# Patient Record
Sex: Male | Born: 1955 | Race: White | Hispanic: No | Smoking: Former smoker
Health system: Southern US, Community
[De-identification: ages and names within clinical notes are randomized; demographics above are authoritative.]

## PROBLEM LIST (undated history)

## (undated) DIAGNOSIS — I499 Cardiac arrhythmia, unspecified: Secondary | ICD-10-CM

## (undated) DIAGNOSIS — G4710 Hypersomnia, unspecified: Secondary | ICD-10-CM

## (undated) DIAGNOSIS — G4730 Sleep apnea, unspecified: Secondary | ICD-10-CM

## (undated) DIAGNOSIS — E785 Hyperlipidemia, unspecified: Secondary | ICD-10-CM

## (undated) DIAGNOSIS — I639 Cerebral infarction, unspecified: Secondary | ICD-10-CM

## (undated) DIAGNOSIS — I4821 Permanent atrial fibrillation: Secondary | ICD-10-CM

## (undated) HISTORY — DX: Sleep apnea, unspecified: G47.10

## (undated) HISTORY — DX: Permanent atrial fibrillation: I48.21

## (undated) HISTORY — DX: Sleep apnea, unspecified: G47.30

## (undated) HISTORY — PX: ELBOW SURGERY: SHX618

## (undated) HISTORY — DX: Cerebral infarction, unspecified: I63.9

## (undated) HISTORY — DX: Hyperlipidemia, unspecified: E78.5

---

## 2001-03-23 ENCOUNTER — Encounter (HOSPITAL_COMMUNITY): Admission: RE | Admit: 2001-03-23 | Discharge: 2001-04-22 | Payer: Self-pay

## 2001-09-07 ENCOUNTER — Emergency Department (HOSPITAL_COMMUNITY): Admission: EM | Admit: 2001-09-07 | Discharge: 2001-09-07 | Payer: Self-pay | Admitting: Emergency Medicine

## 2001-09-12 ENCOUNTER — Ambulatory Visit (HOSPITAL_COMMUNITY): Admission: RE | Admit: 2001-09-12 | Discharge: 2001-09-12 | Payer: Self-pay | Admitting: Family Medicine

## 2001-09-12 ENCOUNTER — Encounter: Payer: Self-pay | Admitting: Family Medicine

## 2001-09-22 ENCOUNTER — Ambulatory Visit (HOSPITAL_COMMUNITY): Admission: RE | Admit: 2001-09-22 | Discharge: 2001-09-22 | Payer: Self-pay | Admitting: Family Medicine

## 2001-09-22 ENCOUNTER — Encounter: Payer: Self-pay | Admitting: Family Medicine

## 2002-03-06 ENCOUNTER — Ambulatory Visit (HOSPITAL_COMMUNITY): Admission: RE | Admit: 2002-03-06 | Discharge: 2002-03-06 | Payer: Self-pay | Admitting: Family Medicine

## 2002-03-06 ENCOUNTER — Encounter: Payer: Self-pay | Admitting: Family Medicine

## 2003-02-14 ENCOUNTER — Ambulatory Visit (HOSPITAL_COMMUNITY): Admission: RE | Admit: 2003-02-14 | Discharge: 2003-02-14 | Payer: Self-pay | Admitting: Family Medicine

## 2003-02-14 ENCOUNTER — Encounter: Payer: Self-pay | Admitting: Family Medicine

## 2004-05-19 ENCOUNTER — Ambulatory Visit (HOSPITAL_COMMUNITY): Admission: RE | Admit: 2004-05-19 | Discharge: 2004-05-19 | Payer: Self-pay | Admitting: Family Medicine

## 2004-06-19 ENCOUNTER — Encounter (HOSPITAL_COMMUNITY): Admission: RE | Admit: 2004-06-19 | Discharge: 2004-07-19 | Payer: Self-pay | Admitting: Family Medicine

## 2007-02-16 ENCOUNTER — Encounter: Admission: RE | Admit: 2007-02-16 | Discharge: 2007-02-16 | Payer: Self-pay | Admitting: Orthopedic Surgery

## 2008-01-07 ENCOUNTER — Ambulatory Visit: Payer: Self-pay | Admitting: Pulmonary Disease

## 2008-01-07 ENCOUNTER — Inpatient Hospital Stay (HOSPITAL_COMMUNITY): Admission: EM | Admit: 2008-01-07 | Discharge: 2008-01-16 | Payer: Self-pay | Admitting: Emergency Medicine

## 2008-01-07 ENCOUNTER — Encounter (INDEPENDENT_AMBULATORY_CARE_PROVIDER_SITE_OTHER): Payer: Self-pay | Admitting: Neurology

## 2008-01-07 ENCOUNTER — Encounter: Payer: Self-pay | Admitting: Emergency Medicine

## 2008-01-08 ENCOUNTER — Encounter (INDEPENDENT_AMBULATORY_CARE_PROVIDER_SITE_OTHER): Payer: Self-pay | Admitting: Neurology

## 2008-01-08 ENCOUNTER — Ambulatory Visit: Payer: Self-pay | Admitting: Surgery

## 2008-01-09 ENCOUNTER — Ambulatory Visit: Payer: Self-pay | Admitting: Physical Medicine & Rehabilitation

## 2008-05-25 ENCOUNTER — Ambulatory Visit (HOSPITAL_COMMUNITY): Admission: RE | Admit: 2008-05-25 | Discharge: 2008-05-25 | Payer: Self-pay | Admitting: Interventional Radiology

## 2008-05-31 ENCOUNTER — Encounter: Payer: Self-pay | Admitting: Interventional Radiology

## 2008-06-06 ENCOUNTER — Ambulatory Visit (HOSPITAL_COMMUNITY): Admission: RE | Admit: 2008-06-06 | Discharge: 2008-06-06 | Payer: Self-pay | Admitting: Cardiology

## 2008-06-06 ENCOUNTER — Encounter (INDEPENDENT_AMBULATORY_CARE_PROVIDER_SITE_OTHER): Payer: Self-pay | Admitting: Cardiology

## 2008-06-07 ENCOUNTER — Encounter: Admission: RE | Admit: 2008-06-07 | Discharge: 2008-06-07 | Payer: Self-pay | Admitting: Orthopedic Surgery

## 2008-06-18 HISTORY — PX: CARDIOVASCULAR STRESS TEST: SHX262

## 2008-07-04 ENCOUNTER — Ambulatory Visit (HOSPITAL_COMMUNITY): Admission: RE | Admit: 2008-07-04 | Discharge: 2008-07-04 | Payer: Self-pay | Admitting: Interventional Radiology

## 2008-07-04 HISTORY — PX: CEREBRAL ANGIOGRAM: SHX1326

## 2009-03-01 ENCOUNTER — Ambulatory Visit (HOSPITAL_COMMUNITY): Admission: RE | Admit: 2009-03-01 | Discharge: 2009-03-01 | Payer: Self-pay | Admitting: Interventional Radiology

## 2009-08-06 IMAGING — CT CT HEAD W/O CM
1 series · 16 of 30 positions shown, 20 images · non-contrast
Comparison: CT from earlier today

CLINICAL DATA: Follow-up after t-PA treatment

CT HEAD WITHOUT CONTRAST
TECHNIQUE: Contiguous axial images were obtained from the base of
the skull through the vertex without contrast.

[Series 2: head routine 4.8 h37s · axial · 0.48mm/px · z∈[-120,+40]mm · 16 of 36 slices shown, 20 images]
[im 2/36  brain]
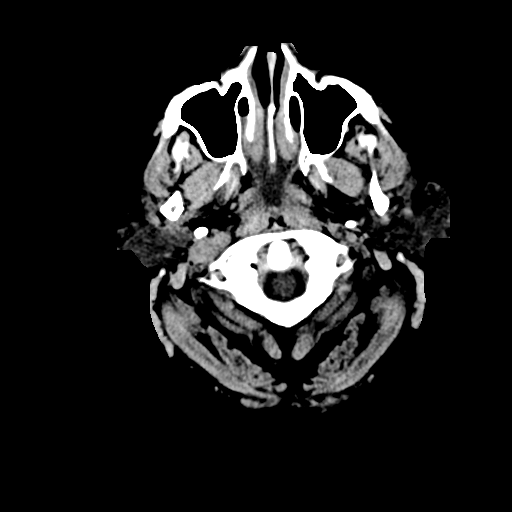
[im 2/36  bone]
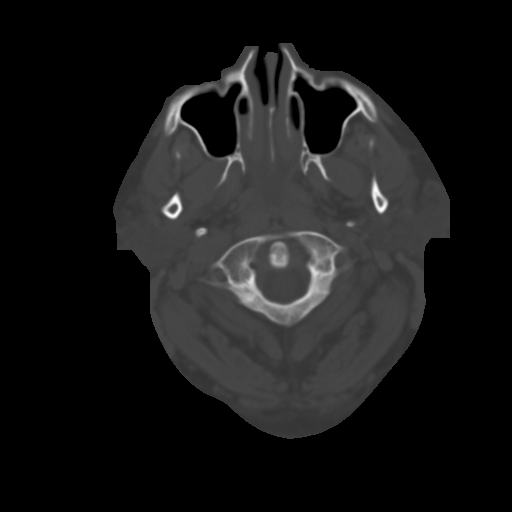
[im 4/36  brain]
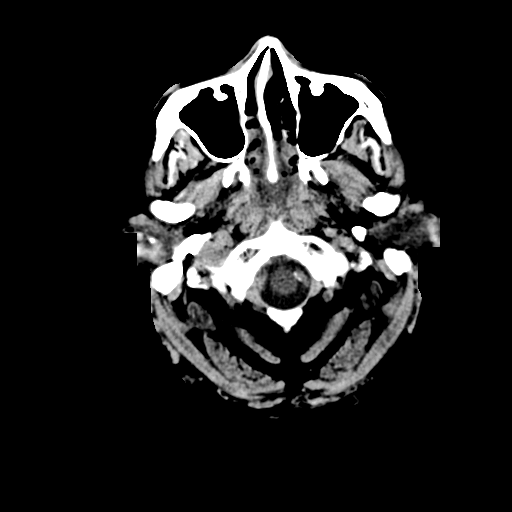
[im 7/36  brain]
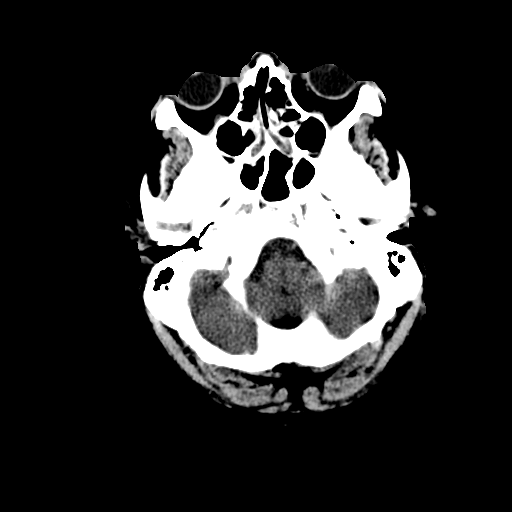
[im 9/36  brain]
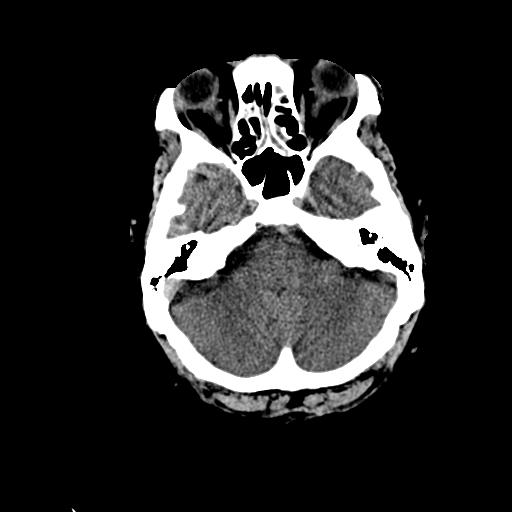
[im 10/36  brain]
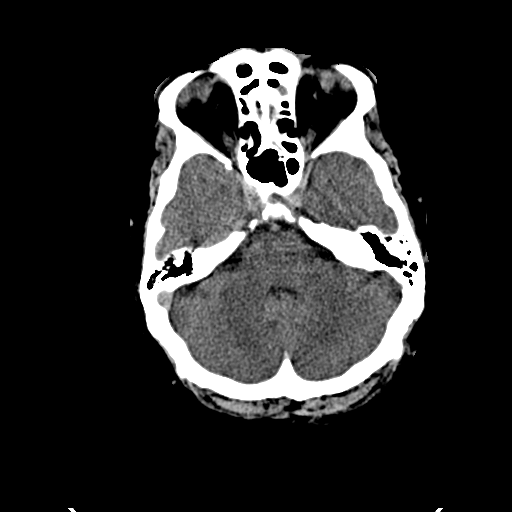
[im 10/36  bone]
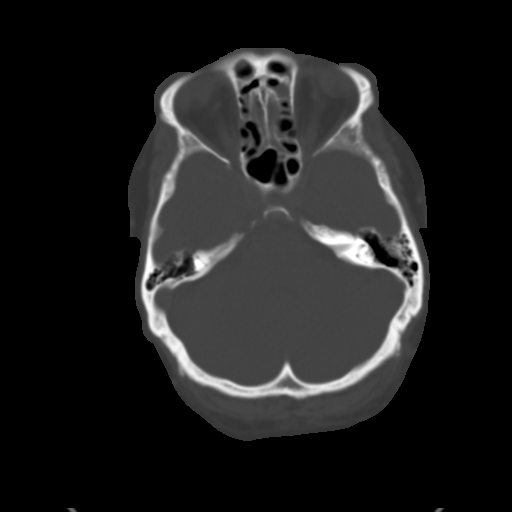
[im 13/36  brain]
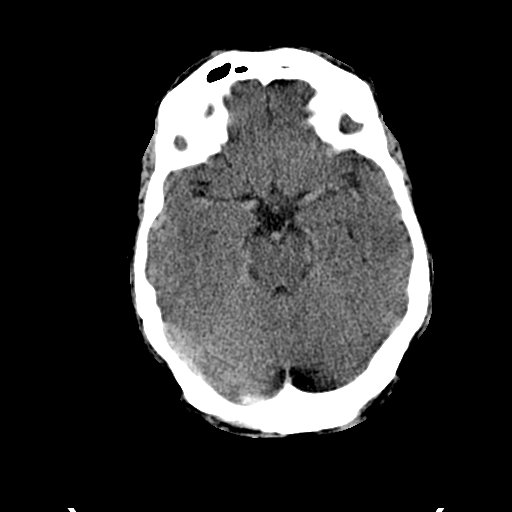
[im 15/36  brain]
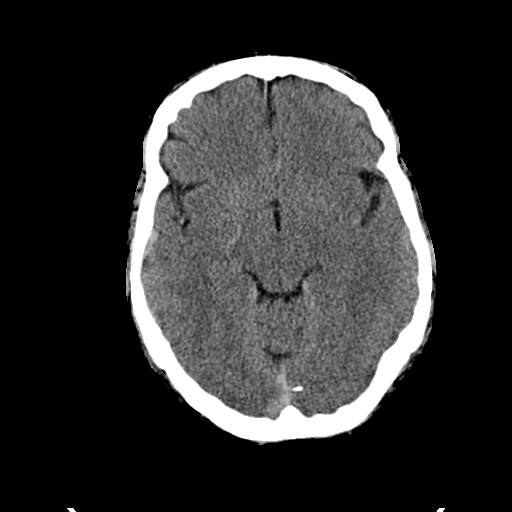
[im 17/36  brain]
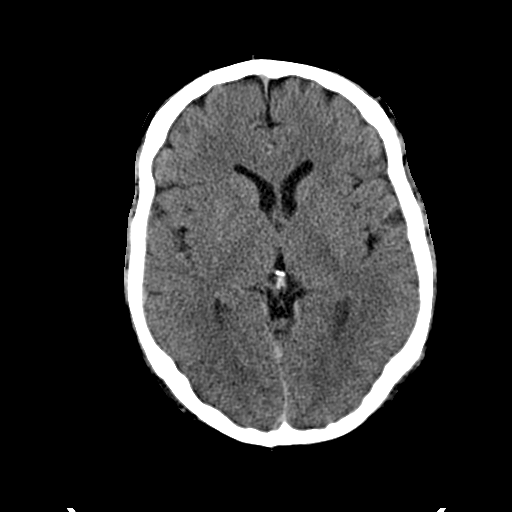
[im 19/36  brain]
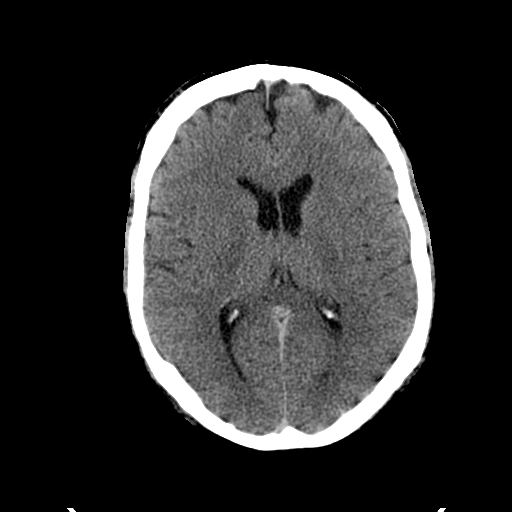
[im 19/36  bone]
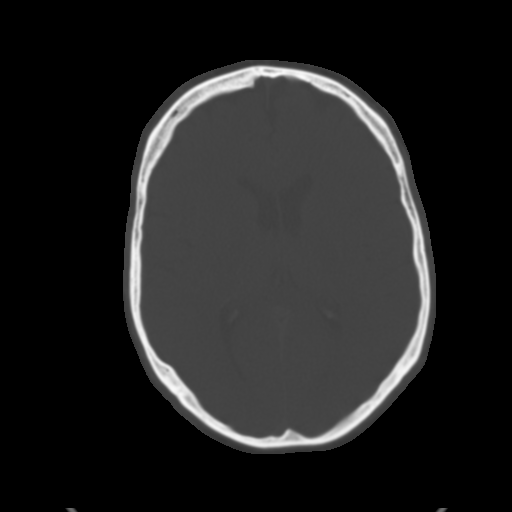
[im 21/36  brain]
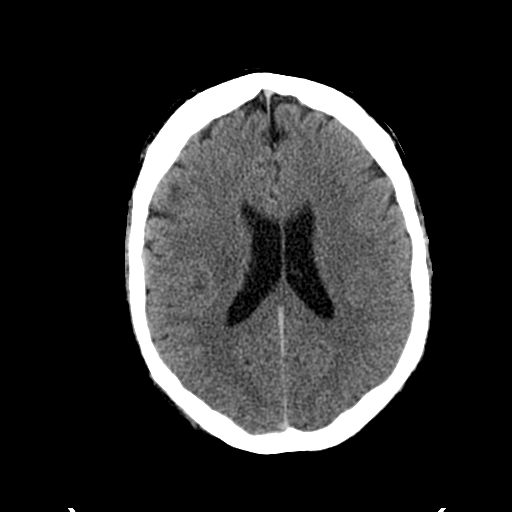
[im 23/36  brain]
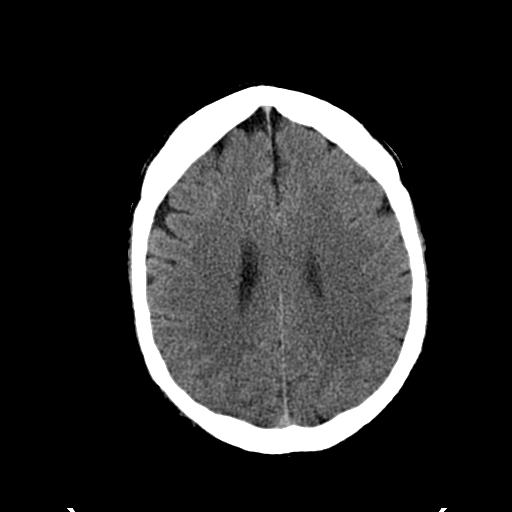
[im 26/36  brain]
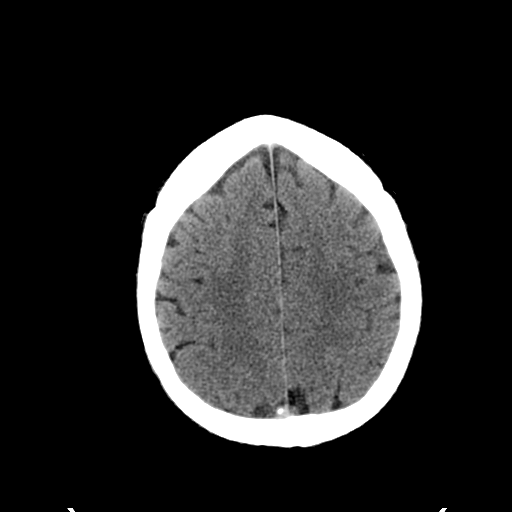
[im 27/36  brain]
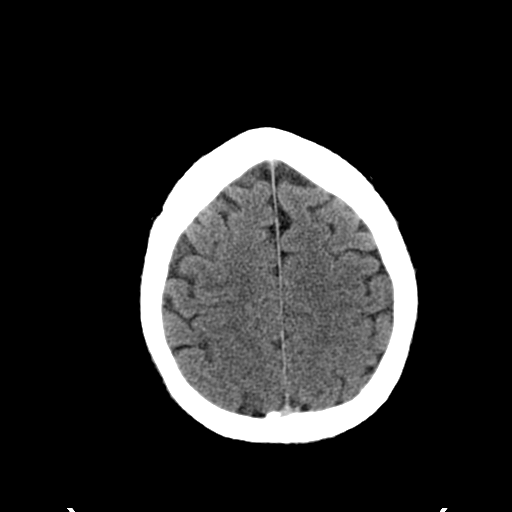
[im 27/36  bone]
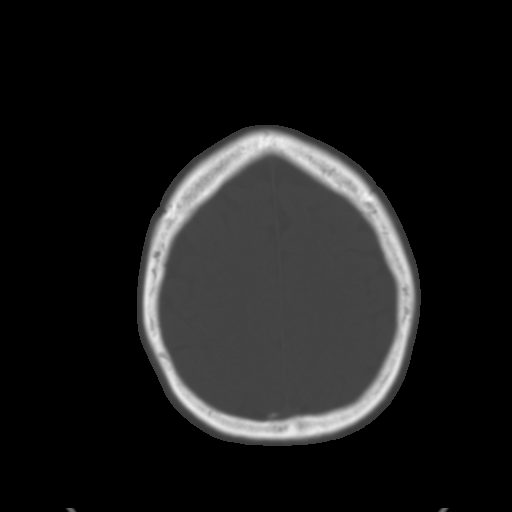
[im 29/36  brain]
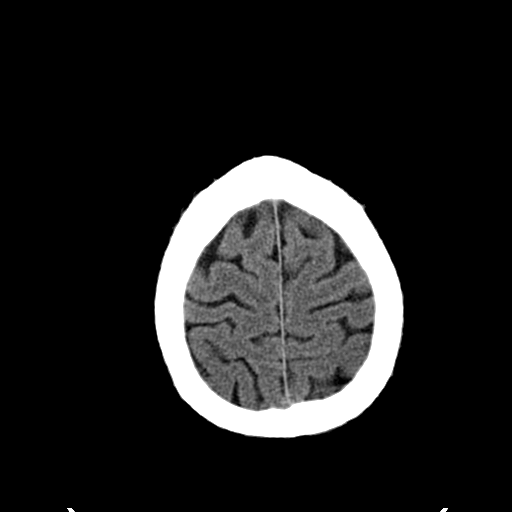
[im 32/36  brain]
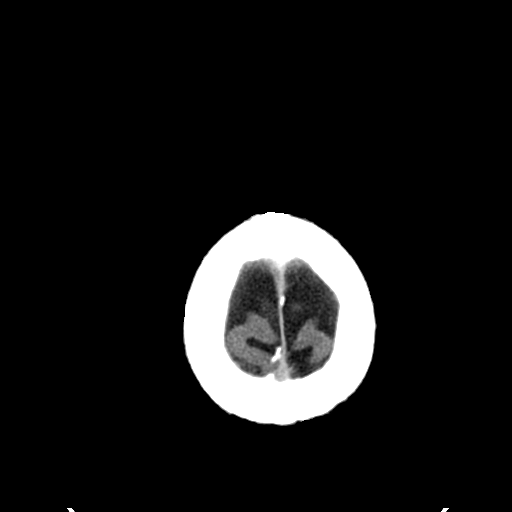
[im 34/36  brain]
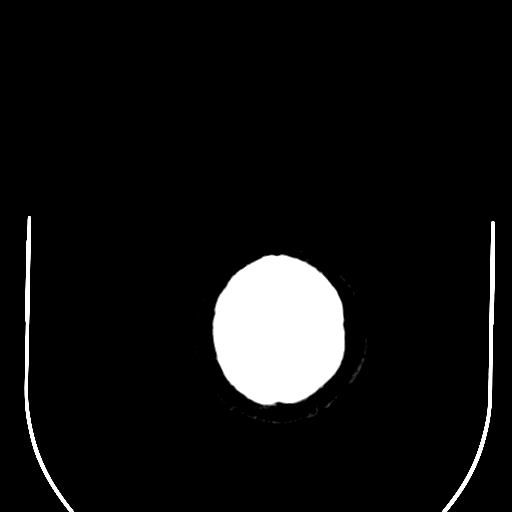

[16 of 30 positions shown; findings below may reference images not displayed]

FINDINGS: There is contrast throughout the circle of Willis from
the arteriographic evaluation.  However the MCA's appear
symmetrically dense and the area of thrombosis appears to have been
treated.  The only questionable abnormality is an area of very
vague low attenuation deep in the white matter on the right on
images number 20 and 21 which may represent edema or early infarct.
No acute blood is seen.  Ethmoid sinus disease is noted.
IMPRESSION: The middle cerebral arteries appear symmetrically dense after t-PA
treatment.  No acute blood is seen. Small vague area of low
attenuation deep in the right white matter suspicious for edema,
possible early infarction.

## 2009-08-06 IMAGING — XA IR ANGIO/CAROTID/CERV BI
1 series · 10 of 24 positions shown · non-contrast
Comparison: CT scan of the brain of 01/07/2008.

01/12/08 – DUPLICATE COPY for exam association in [facility] – No change from original report.
 Clinical Data acute onset of left side weakness with gaze deviation
 and dysarthria. Bilateral carotid arteriograms, left vertebral
 artery angiogram, and endovascular revascularization of occluded
 right internal carotid artery, the right middle cerebral artery and
 partially the right anterior cerebral artery using intra-arterial
 TPA AND MECHANICAL THROMBECTOMY.

[Series 1: run · 10 of 243 slices shown]
[im 11/243]
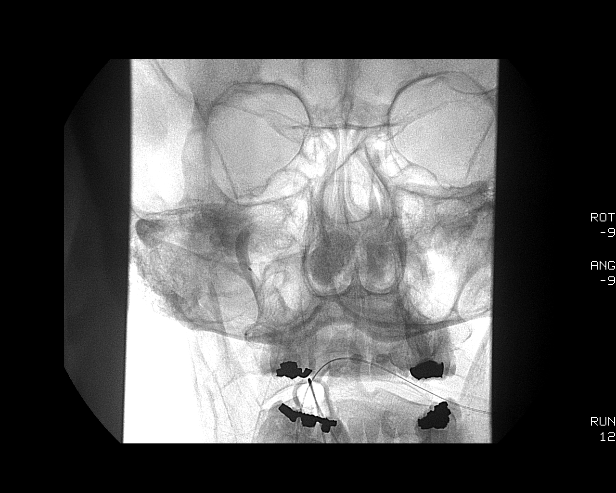
[im 32/243]
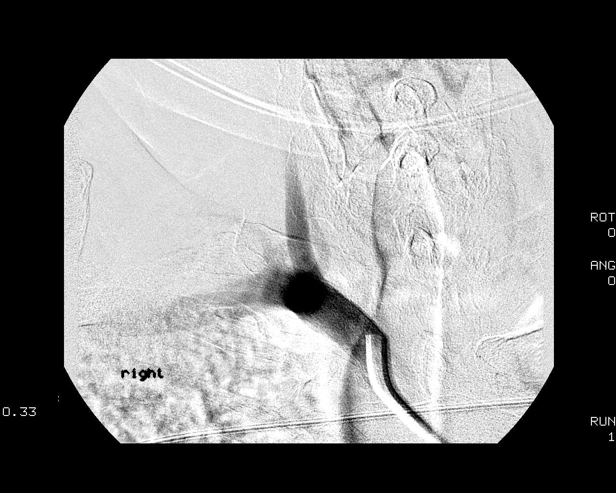
[im 64/243]
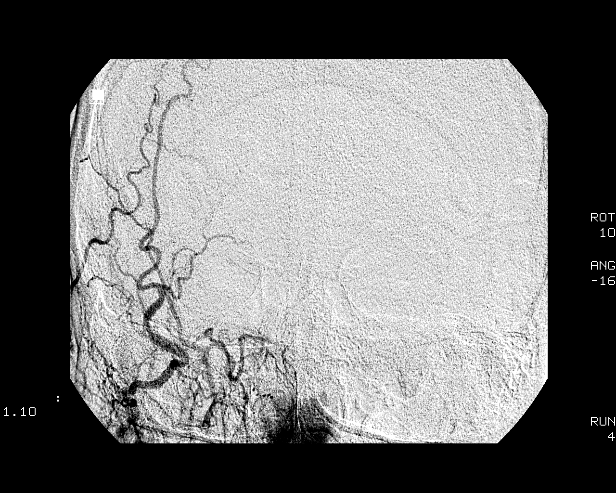
[im 85/243]
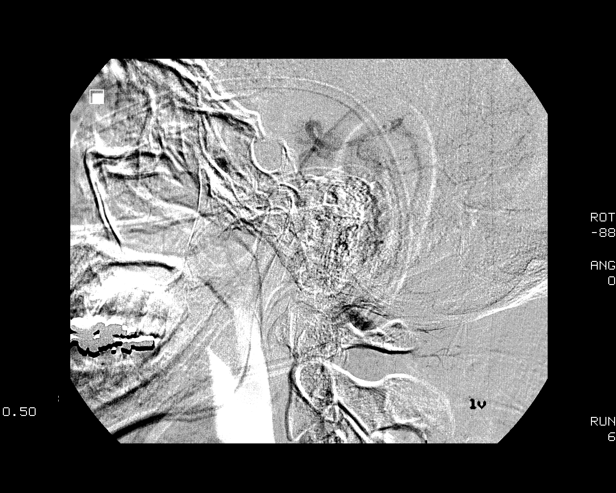
[im 106/243]
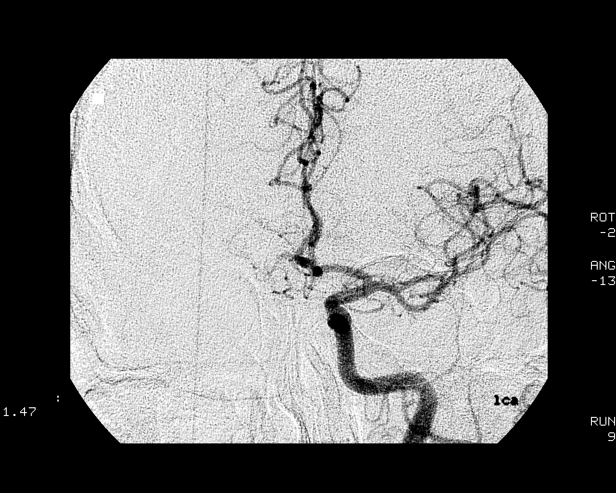
[im 137/243]
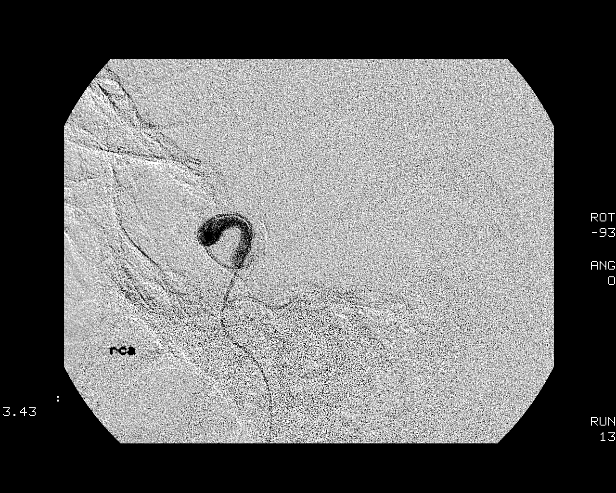
[im 158/243]
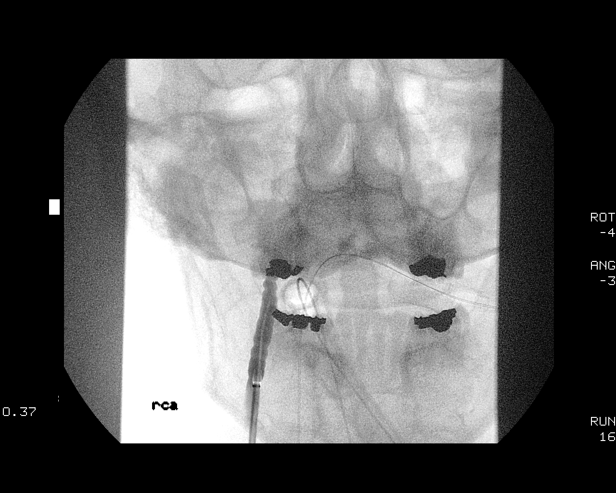
[im 190/243]
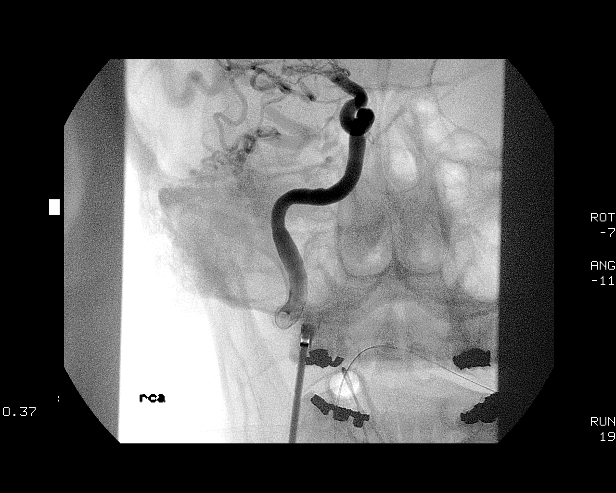
[im 211/243]
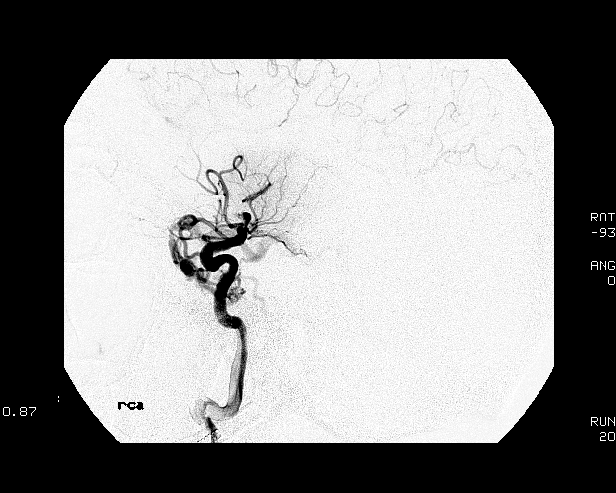
[im 232/243]
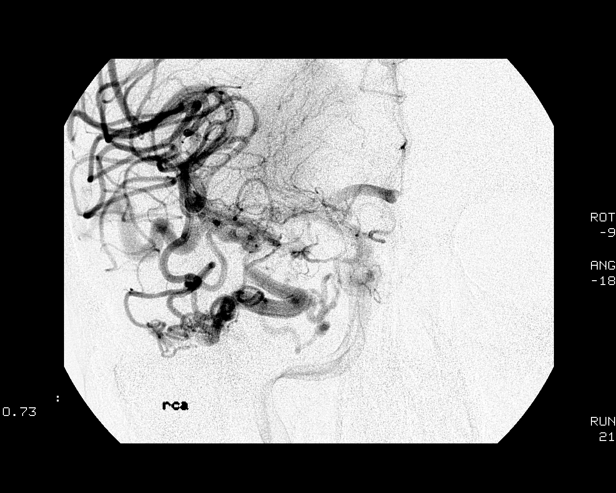

[10 of 24 positions shown; findings below may reference images not displayed]

FINDINGS: Following full explanation of the procedure along with
 potential associated complications, an informed witnessed consent
 was obtained.

 The right groin was prepped and draped in usual sterile fashion.
 Thereafter using a modified Seldinger technique, transfemoral
 access into the right common femoral artery was obtained without
 difficulty. Over 0.035 inches guide wire, a 5-French Pinnacle
 sheath was inserted. Through this and also 0.035 inch guide wire,
 5-French JB-1 catheter was advanced through aortic arch regions and
 was selectively positioned in the right common carotid artery, the
 left vertebral artery, and the left common carotid artery.

 There were no acute complications. The patient tolerated the
 procedure well.

 Medication utilized: Versed 1 mg IV. Fentanyl 25 mcg IV.

 Contrast: Omnipaque 300, approximately 35 cc.

 The right common carotid arteriogram demonstrates the right
 external carotid artery and its major branches to be normal.

 The right internal carotid artery at the bulb is patent. There is
 a tapered severe narrowing to complete occlusion of the right
 internal carotid artery to the level of C1- C2. No distal
 reconstitution of the right internal carotid artery at the skull
 base or intracranially is noted.

 The left vertebral origin is normal. The vessel is seen to opacify
 normally to the cranial skull base. No opacification is seen on
 left posterior-inferior cerebral artery and the left
 vertebrobasilar junction. The basilar artery, posterior cerebral
 arteries, superior cerebral arteries and the anterior- inferior
 cerebral arteries are seen to opacify normally into capillary and
 venous phases.

 The left common carotid arteriogram demonstrates left external
 carotid artery and its major branches to be normal.

 The left internal carotid artery at the bulb to the cranial skull
 base is normally opacified. The petrous, cavernous and
 supraclinoid segments are normal.

 The left middle and left anterior cerebral arteries are also seen
 to opacify normally into capillary and venous phases.

 Cross opacification via the anterior communicating artery region is
 noted of the right anterior cerebral artery and A2 segment, and
 partially in the A1 segment.
IMPRESSION: 1. Complete occlusion of the right internal carotid artery
 extracranially and intracranially.
 2. Angiographic occlusion of the right middle cerebral artery and
 partially the right anterior cerebral artery A1 segment

 ENDOVASCULAR REVASCULARIZATION OF THE OCCLUDED RIGHT INTERNAL
 CAROTID ARTERY INTRA AND EXTRA CRANIALLY, AND THE RIGHT MIDDLE
 CEREBRAL ARTERY AND THE RIGHT ANTERIOR CEREBRAL ARTERY USING
 MECHANICAL THROMBECTOMY IN THE FORM MERCI RETRIEVAL DEVICE AND
 INTRA-ARTERIAL SUPERSELECTIVE TPA INFUSION.

 The findings of the angiogram were reviewed with the referring
 neurologist. It was decided to proceed with endovascular
 revascularization to improve prior subsequent neurological outcome.
 The risks of the procedure including a 10-15% chance of
 intracranial hemorrhage with the potential for worsening neurologic
 function, death, and ventilator dependency were discussed with the
 patient's family.

 After informed consent, the patient's was put general anesthesia by
 the [REDACTED], [HOSPITAL].

 The diagnostic JB-1 5-French diagnostic catheter was exchanged over
 a 0.035 inch 300 cm Rosen exchange guide wire for an 8-French 55 cm
 neurovascular sheath using biplane roadmap technique and constant
 fluoroscopic guidance. Good aspiration was obtained from the side
 port of the neurovascular sheath. A gentle contrast ejection
 demonstrated no evidence of spasms, dissections or intraluminal
 filling defects. This was then connected to continue Heparin and
 saline infusion.

 Over the Rosen exchange guide wire, a 90 cm 8-French Merci guide
 catheter was advanced and positioned just proximal to the right
 common carotid bifurcation. The guide wire was removed. There was
 good aspiration obtained from the hub of the 8-French guide
 catheter. A gentle contrast injection demonstrated no evidence of
 spasms, dissections or intraluminal filling defects.

 Over 0.035 inch Roadrunner guide wire using biplane roadmap
 technique and constant fluoroscopic guidance, the 8-French guide
 catheter was positioned just distal to the right internal carotid
 bulb. Again been no evidence of filling defects or dissection are
 noted in the right internal carotid artery. No change in the
 intracranial circulation.

 At this time in a coaxial manner and with constant heparin and
 saline infusion using biplane roadmap technique and constant
 fluoroscopic, over a 0.014-inch Soft-tip Transcend EX
 microguidewire with the J-tip configuration, an 18L Merci
 microcatheter which had been steam shaved was advanced and
 positioned to the distal end of the 8-French guide catheter.

 With the microguidewire leading in a J-tip configuration, access
 was obtained into the right internal carotid artery at the cervical
 petrous junction. At this time, the microguidewire was removed.
 Good aspiration was obtained from the hub of the microcatheter.

 Using biplane roadmap technique and constant fluoroscopic guidance,
 again with the mild microguidewire leading in J-tip configuration,
 the combination of microguidewire and microcatheter was then
 advanced into the right ICA supraclinoid region and subsequently to
 the right middle cerebral artery with the microguidewire leading.
 The microguidewire was then advanced into the M2-M3 region of the
 right middle cerebral artery followed by the microcatheter.

 The microguidewire was then retrieved. Again, there is no
 significant aspiration from the hub of the microcatheter.
 Controlled arteriogram performed through the 8-French Merci guide
 catheter in the right internal carotid artery and again
 demonstrated no change in the intracranial circulation.

 At this time, intra-arterial infusion of TPA was started. Over 10
 minutes, 10 mg of IV TPA were infused in the right middle cerebral
 artery region.

 At the end of this, over a 0.014-inch Softip Transcend EX
 microguidewire, the microcatheter was again advanced into the M2
 region of the right middle cerebral artery. The guide wires were
 then removed. Again, there was no prompt aspiration from the hub
 of the microcatheter

 At this time, Ennis Billiot retrieval device L5 was advanced in a
 coaxial manner and with constant heparin and saline infusion using
 roadmap technique to the distal end of the microcatheter. Two
 loops of the retrieval device were then advanced followed by the
 combination being retrieved proximally to engage the large filling
 defect. The further loops were then delivered.

 The balloon in the right internal carotid was then inflated for
 proximal flow rest as a rigorous aspiration was performed from the
 side port of the retrievals on the 8-French [REDACTED] guide catheter as
 slow gradual retrieval was performed of the combination of
 retrieval device and of the microcatheter. Using a torque device,
 the retrieval device was turned in a clockwise and anticlockwise
 motion as the retrieval was being performed. This was continued
 until the retrieval was then outside the 8-French guide catheter.
 Aspiration was continued for another minute once the balloon was
 inflated. The balloon was then deflated while aspiration was
 continue to be performed rigorously. Back-bleeding was allowed for
 approximately 15-20 seconds. At the end of this, a controlled
 arteriogram was performed through the 8-French guide catheter from
 the side port demonstrated complete revascularization of the right
 internal carotid artery and right anterior cerebral artery and the
 proximal right middle cerebral artery. Large chunks of dark clot
 were obtained in the aspirate.

 Further advancement of the [REDACTED] microcatheter as described earlier
 over 0.014 inch Transcend Soft-tip EX microguidewire to the right
 MCA. Once within the clot, another 7 mg of intra-arterial TPA were
 infused in hope of softening the clot. At the end of this, another
 L5 retrieval device was advanced in a coaxial manner under constant
 heparin and saline infusion. As described above, another pass was
 made with proximal flow rest and rigorous aspiration as the
 combination of the retrieval device and the microcatheter were
 retrieved. At this time, there was no significant amount of clot
 noted in the aspirate. Also the controlled arteriogram through the
 8-French guide wire confirmed no further revascularization to the
 right including the right middle cerebral artery. This prompted
 yet another at attempt with another L5 retrieval device. Again
 after having engaged the clot with two loops of the retrieval
 device, the remaining loops were delivered. Two anti- clockwise
 and two clockwise maneuvers were performed with torque device and
 retrieval device. With rigorous aspiration, the combination was
 retrieved slowly and deliberately. The microcatheter and retrieval
 device were retrieved and removed. This retrieval device showed
 the presence of a thick pyramidal shaped clot. The aspirate also
 demonstrated chunks of clot. A controlled arteriogram performed
 through the 8-French guide catheter now demonstrated a
 revascularization of the anterior temporal branch and also the
 right M1 segment in a superior division. There continued to be a
 sudden defect probably at the origin of the inferior division of
 the right middle cerebral artery. Also demonstrated at this run
 was the presence of a nidus of an anterior malformation in the
 anterior region of the right temporal lobe fed by branches from the
 anterior temporal branch. The venous draining was noted into the
 vein of Labbe and subsequently into the right transverse sinus.

 The patient was given 1 ml of 0.25% nitroglycerin via the guide
 catheter. A control arteriogram performed subsequent to this and
 demonstrated complete revascularization of the inferior and
 superior division of the right middle cerebral artery and the right
 anterior cerebral artery with more prompt filling of the anterior
 temporal arteriovenous malformation with venous drainage via the
 cortical anterior aspect of the anterior temporal lobe into the
 vein of Labbe.

 A following control arteriogram performed through the 8-French
 guide demonstrated completely revascularization of the right middle
 cerebral artery, the right anterior cerebral artery, the internal
 carotid artery with no filling defects noted in the distal MCA
 branches.

 The guide sheath and the guide catheter were retrieved into the
 abdominal aorta and exchanged over a J-tip guide wire for a 9-
 French neurovascular sheath. This was connected to continuous
 heparin and saline fusion.

 No acute changes are noted in patient's blood pressure and
 neurological status throughout the procedure.

 The patient was then transferred to the CT scanner for post-
 procedural CT scan of the brain.
IMPRESSION: 1. Status post completely revascularization of occluded right
 internal carotid artery intracranially and extracranially, the
 right middle cerebral artery and right anterior cerebral artery
 using superselective intra-arterial infusion of TPA AND MECHANICAL
 THROMBECTOMY times three using the [REDACTED] retrieval device system.
 2. Unmasking of a moderate sized anterior temporal arteriovenous
 malformation on the right.

## 2009-08-06 IMAGING — CR DG CHEST 1V
3 series · 3 of 3 positions shown · non-contrast
Comparison: None

CLINICAL DATA: Sudden onset left-sided weakness

CHEST - 1 VIEW

[view not recorded (1 of 3)]
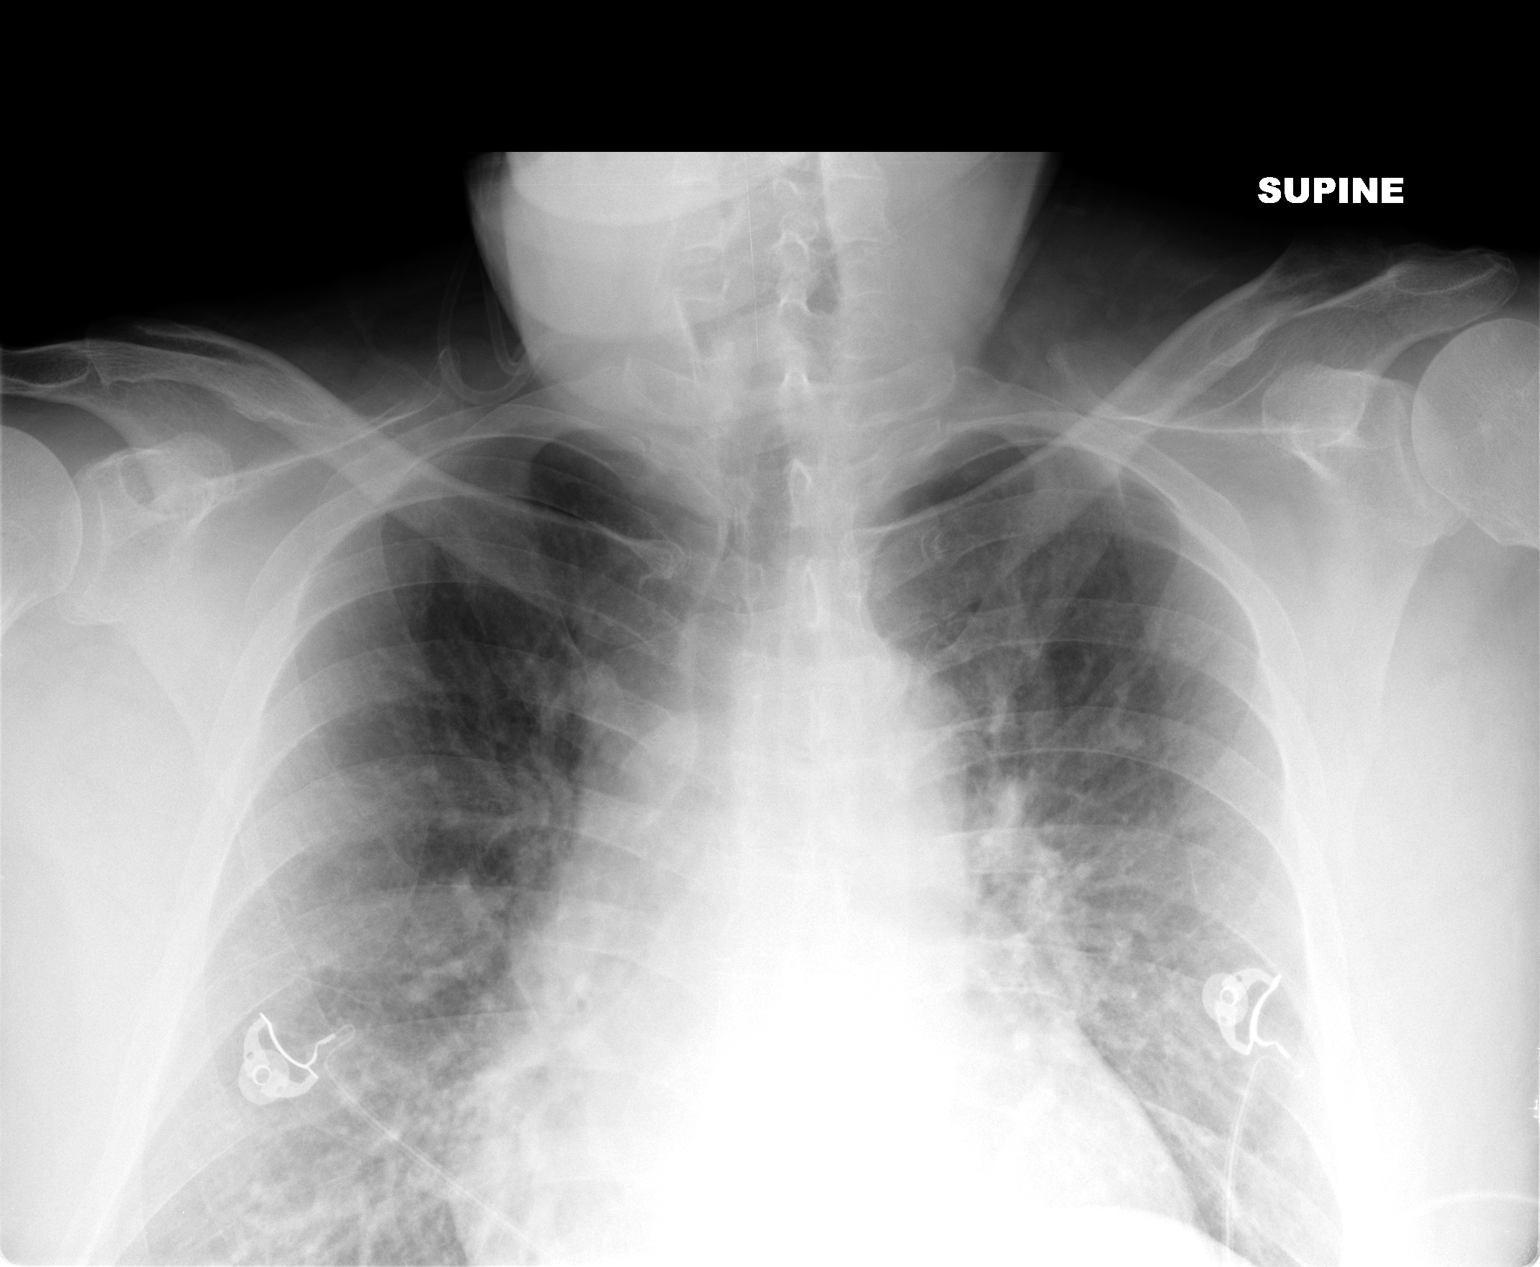

[view not recorded (2 of 3)]
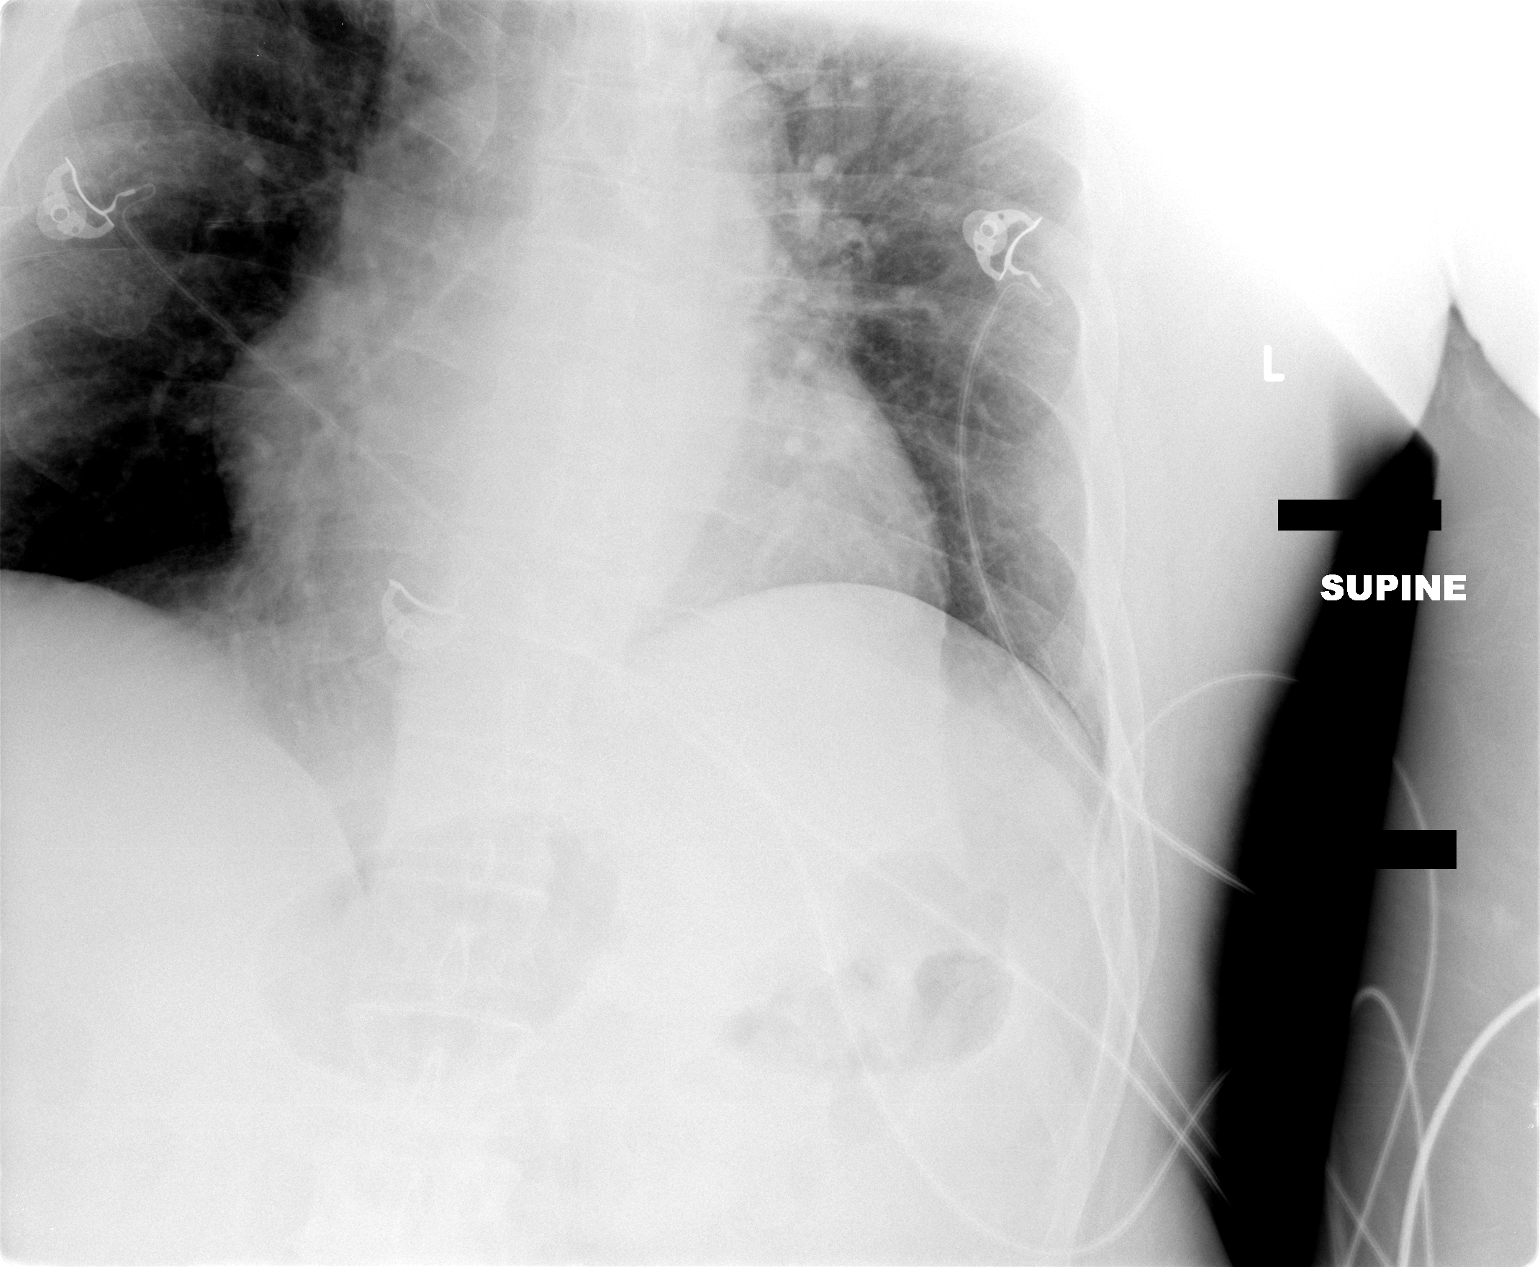

[view not recorded (3 of 3)]
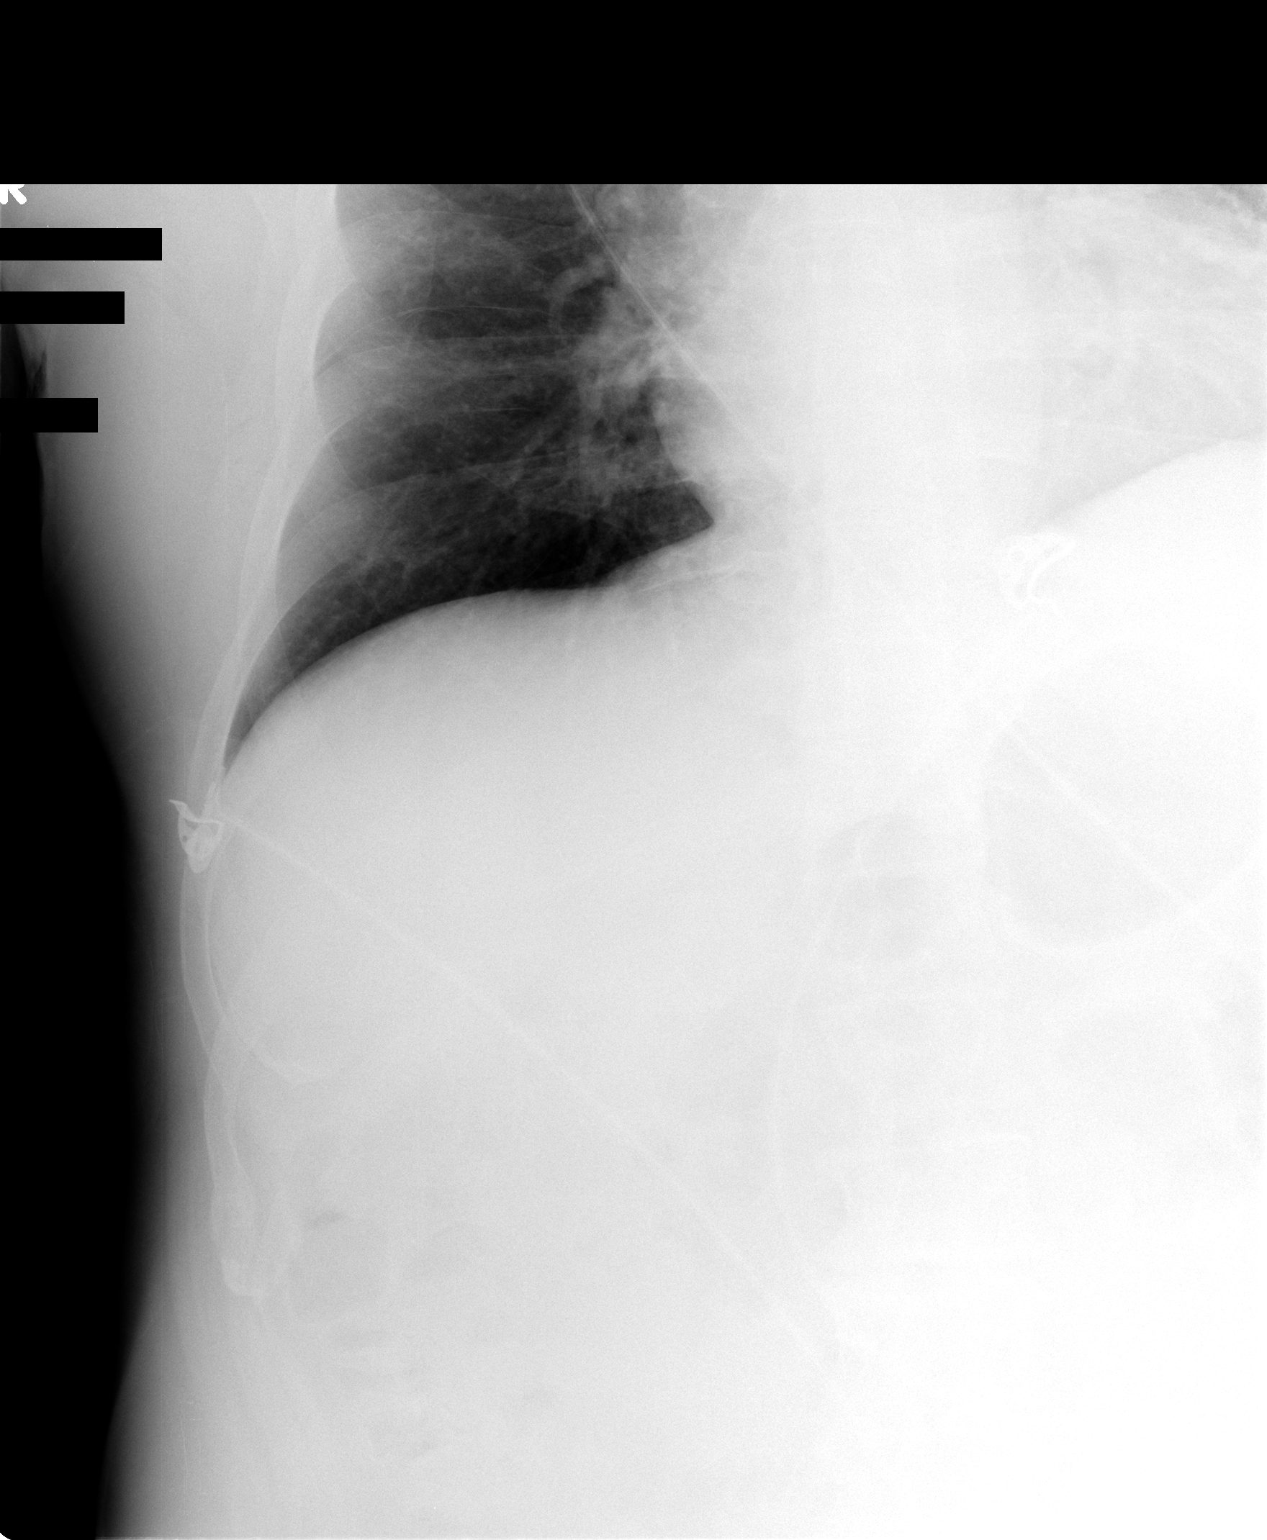

[3 of 3 positions shown; findings below may reference images not displayed]

FINDINGS: The lungs are clear.  Heart is mildly enlarged.  No bony
abnormality is seen.
IMPRESSION: Mild cardiomegaly.  No active lung disease.

## 2010-08-01 HISTORY — PX: TRANSTHORACIC ECHOCARDIOGRAM: SHX275

## 2010-08-25 ENCOUNTER — Encounter: Payer: Self-pay | Admitting: Interventional Radiology

## 2010-12-16 NOTE — H&P (Signed)
NAME:  Cory Petty, Cory Petty                    ACCOUNT NO.:  [id number]   MEDICAL RECORD NO.:  [id number]          PATIENT TYPE:  AMB   LOCATION:                               FACILITY:  [facility]   PHYSICIAN:  Sanjeev K. Deveshwar, M.D.DATE OF BIRTH:  July 31, 1956   DATE OF ADMISSION:  07/04/2008  DATE OF DISCHARGE:                              HISTORY & PHYSICAL   CHIEF COMPLAINT:  Cerebral arteriovenous malformation.   HISTORY OF PRESENT ILLNESS:  This is a pleasant 55 year old male who was  admitted to [facility], January 07, 2008 to January 16, 2008, with a  right middle cerebral artery CVA.  During that stay, the patient had a  MERCI mechanical thrombectomy with intra-arterial TPA, performed by Dr.  Corliss Skains.  During that procedure, the patient was also noted to have a  right temporal AV malformation.  It was felt that the stroke was most  likely embolic due to a history of atrial fibrillation with  subtherapeutic anticoagulation.   The patient did well following his discharge from the hospital.  He did  return to see Dr. Corliss Skains in followup on May 31, 2008.  A decision  was made at that time to have the patient return to [facility]  on June 04, 2008, to undergo a repeat cerebral angiogram and possible  Onyx ablation of his AV malformation.  The patient is admitted at this  time for that procedure.   PAST MEDICAL HISTORY:  As noted, the patient had a right middle cerebral  artery CVA on January 07, 2008.  This was felt to be embolic related to  atrial fibrillation with subtherapeutic anticoagulation.  He underwent a  MERCI thrombectomy, as well as intra-arterial tPA.  The patient is  followed by Dr. [name] for his atrial fibrillation.  The patient also has  a history of ongoing tobacco use.  He has been smoking 1 pack per day  for at least 20+ years.  He is trying to quit.  He is down to about 5  cigarettes per day this time.  The patient also has a history of  hypertension  and restless legs syndrome.  He had a 2-D echo during his  admission for his CVA.  This revealed an ejection fraction of 60%.   The patient was seen by Dr. [name], his cardiologist, in preparation for  the treatment of the AV malformation.  Dr. [name] recommended bridging  with Lovenox after discontinuing the Coumadin in order to perform the  ablation.   SURGERIES:  The patient has had no major previous surgeries.   ALLERGIES:  No known drug allergies.   Medications at the time of admission included Lovenox 120 mg  subcutaneously b.i.d., his last dose was Tuesday morning prior to his  Wednesday admission.  He was also on atenolol 100 mg at bedtime, digoxin  0.25 mg at bedtime.  He was previously on Coumadin 4 mg daily, Crestor  10 mg daily, gabapentin 300 mg daily, and he had been placed on baby  aspirin 81 mg daily.   SOCIAL HISTORY:  The patient is widowed.  [place]  He does have a supportive stepdaughter.  He has no children  of his own.  He continues to smoke as noted.  He does not use alcohol.  He previously worked for [employer].   FAMILY HISTORY:  His mother died from lung cancer in her 15s.  His  father died in his 74s or 45s from stomach cancer.   LABORATORY DATA:  An INR was 1.3 with a PT of 16.6, PTT was 37, BUN was  11, creatinine 0.92, potassium was 4.4.  GFR was greater than 60.  Glucose was 98.  Hemoglobin 68.1, hematocrit 48.8, WBC 7.3000, platelets  189,000.   Review of systems is completely negative except for a nonproductive  cough, and he has had some left arm pain since his stroke.  He also  reports bruising easily due to anticoagulation.   PHYSICAL EXAMINATION:  GENERAL:  A pleasant 55 year old white male, in  no acute distress.  VITAL SIGNS:  Blood pressure 118/78, pulse 78, respirations 18,  temperature 97, oxygen saturation 96% on room air.  HEENT:  Unremarkable.  NECK:  No bruits.  HEART:  Regular rate and rhythm  without murmur.  LUNGS:  Clear.  ABDOMEN:  Soft, nontender.  EXTREMITIES:  Pulses to be weak but intact.  He had mild edema  bilaterally.  His airway was rated at a 2.  His ASA scale was rated at a 3.  NEUROLOGIC:  Nonfocal with motor strength being 5/5 throughout.   IMPRESSION:  1. 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  2. History of right middle cerebral artery cerebrovascular accident,      January 07, 2008, treated with intra-arterial tPA and a mechanical      embolus removal in cerebral ischemia mechanical thrombectomy.  3. History of hypertension.  4. History of atrial fibrillation.  5. Chronic Coumadin therapy.  6. Ongoing tobacco use.  7. Recent 2-D echocardiogram revealing ejection fraction of 60%.  8. History of restless legs syndrome.   As noted, the patient was to be admitted today for a possible cerebral  arteriovenous  malformation ablation.  A cerebral angiogram was  performed under conscious sedation.  Dr. Corliss Skains felt that the  arteriovenous malformation was stable and would not require treatment at  this time.  Therefore, there was no intervention and plans are for the  patient to be released to home later today.      Delton See, P.A.    ______________________________  Grandville Silos. Corliss Skains, M.D.    DR/MEDQ  D:  07/04/2008  T:  07/04/2008  Job:  [id number]

## 2010-12-16 NOTE — Discharge Summary (Signed)
NAME:  Scavone, Anthon                    ACCOUNT NO.:  [id number]   MEDICAL RECORD NO.:  [id number]          PATIENT TYPE:  INP   LOCATION:  3005                         FACILITY:  [facility]   PHYSICIAN:  Pramod P. Pearlean Brownie, MD    DATE OF BIRTH:  Mar 29, 1956   DATE OF ADMISSION:  01/07/2008  DATE OF DISCHARGE:  01/16/2008                               DISCHARGE SUMMARY   DIAGNOSES AT THE TIME OF DISCHARGE:  1. 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  Status post two-      thirds tissue plasminogen activator, interarterial tissue      plasminogen activator, and mechanical thrombolysis with Merci      device.  2. Incidental right temporal arteriovenous malformation just past      occlusion.  3. Hypertension.  4. Atrial fibrillation.  5. Cigarette smoker.   MEDICINES AT THE TIME OF DISCHARGE:  1. Lanoxin 25 mcg a day.  2. Crestor were 10 mg twice a day.  3. Tylenol p.r.n.  4. Coumadin 5 mg Monday, Wednesday, Friday, and Saturday.  5. Coumadin 2.5 Tuesday, Thursday, and Sunday.   STUDIES PERFORMED:  1. CT of the brain on admission showed a dense right middle cerebral      artery sign with right MCA infarct.  Chest x-ray shows mild      cardiomegaly, no lung disease.  Cerebral angiogram shows complete      occlusion of the right internal carotid artery extracranially and      intracranially.  Angiographic occlusion of the right middle      cerebral artery and partially the right anterior cerebral artery A1      segment.  During angio status post completely revascularization of      occluded right internal carotid artery intracranially and      extracranially.  The right middle cerebral artery and right      anterior cerebral artery using superselective intra-arterial      infusion of t-PA and mechanical thrombectomy x3 using the Merci      Retrieval device.  Unmasking of a moderate-sized anterior temporal      AVM on the  right.  2. CT of the brain status post angiogram shows middle cerebral      arteries appear symmetrically dense after t-PA, no acute blood is      seen, and then CT of the brain at 24 hours showed more extensive      low attenuation throughout the right basal ganglion the white      matter on the right consistent with infarct.  The MCAs are      asymmetrical.  3. MRI of the brain shows acute infarct in the right basal ganglia.      There are small cortical embolic infarcts in the insula and right      parietal cortex.  There is a small amount of hemorrhage within the      right putamen.  MRA of the head is negative.  4. A 2-D  echo is 60% with no left ventricular regional wall motion      abnormalities.  Carotid Doppler shows right 40%-60% ICA stenosis,      mid range, left no ICA stenosis, bilateral vertebral artery flow      was antegrade.  5. EKG shows atrial fibrillation with minimal voltage criteria for      LVH, maybe normal variant.   LABORATORY STUDIES:  INR on day of discharge 2.6, CBC with red blood  cells 3.97, hematocrit 38.4, otherwise normal.  Homocystine 11.6.  Cardiac enzymes negative.  Lipid profile with cholesterol 122,  triglycerides 52, HDL 32, LDL 80.  Chemistry with glucose 110, otherwise  normal.  Calcium 8.1 and hemoglobin A1c 5.7   HISTORY OF PRESENT ILLNESS:  [name] is a 55 year old right-handed  Caucasian male with a history of atrial fibrillation on chronic  anticoagulation, although there is some question with compliance.  The  patient was with friends at approximately 9:50 the day of admission when  he developed sudden onset of slurred speech and weakness on the left  upper and left lower extremities.  EMS was alerted immediately and he  was brought to the [facility] where he was found to have a left  hematemesis with slurred speech.  CT of the head was performed and  demonstrated the presence of a dense right MCA signs.  In conversation   with the ED physician at [facility], ED was advised that the patient  receive two-thirds dose IV t-PA and be shifted to [facility] for further  management.  Two-thirds dose was given, and upon his arrival to [facility] was evaluated by the Stroke Service.  He continued to have dense  left hematemesis.  Decision was made to take him to the angiogram suite  for further intervention and due to his right brain stroke.  Afterwards,  he will be admitted to the ICU.   HOSPITAL COURSE:  Angiogram was completed from the emergency room where  he was found to have a total right ICA occlusion which was reopened with  resultant interarterial t-PA and Merci Retrieval device used x3.  The  patient was totally revascularized the right ACA and MCA.  Just beyond  the MCA occlusion, he was found to have a rather large temporal AVM.  His stroke was found to be embolic caused by his history of atrial  fibrillation with subtherapeutic Coumadin.  He was placed back on IV  heparin and Coumadin for secondary stroke prevention.  His INR on  admission was 1.0.  Three days post angiogram, the patient had  spontaneous left groin hemorrhage.  Heparin was stopped and bleeding  measures were put into place.  His hemoglobin remained stable, and his  IV heparin was resumed 12 hours later and Coumadin was continued.  He  was evaluated by PT and OT and Speech and felt to benefit from inpatient  rehab.  He was not a rehab candidate secondary to no insurance source.  The patient lived with stepdaughter who plan to take him home, but upon  visiting him one day witnessed him falling and the difficulty of the  nursing staff in trying to get him up.  Plans were for skilled nursing  facility placement.  Over the weekend, the patient continued to improve,  and by Monday he was reassessed and reevaluated by PT and OT and felt he  would be safe to return home with stepdaughter.  Given increase in the  amount of strength, the  daughter was agreeable and arrangements were  made for discharge home with her.  We will do outpatient PT and OT for  family support and continued therapy needs.  Of note, his Coumadin was  therapeutic at 2.6.  His previous home dose of Coumadin 5 mg in the  morning and 2.5 in the afternoon was changed to Coumadin 5 mg four times  a week and Coumadin 2.5 mg three times a week.  The patient also was  very hypotensive in the hospital and his Tenormin 50 mg three times a  day and hydrochlorothiazide 12.5 mg every day as needed were  discontinue.  On the day of discharge, his blood pressure was in the  90s/50s.   CONDITION AT DISCHARGE:  The patient was awake, alert, and oriented.  No  aphasia and no dysarthria.  He has minimal left facial weakness.  He has  no drift, but has decreased grip and fine motor movements, 4/5 strength  in the left side.  He has decreased sensation also on the left.  His  heart rate is irregular.   DISCHARGE PLAN:  1. Discharged home with stepdaughter with home health PT and OT.  2. Coumadin for stroke prevention.  3. Address aneurysm treatment in the future.  4. Consider IRS trial.  5. Follow up with primary care physician within 1 month.  6. Follow up Dr. Delia Heady in 2-3 months.      Annie Main, N.P.    ______________________________  Sunny Schlein. Pearlean Brownie, MD    SB/MEDQ  D:  01/16/2008  T:  01/17/2008  Job:  [id number]   cc:   Pramod P. Pearlean Brownie, MD  Kenna Gilbert

## 2010-12-16 NOTE — H&P (Signed)
Cory Petty, Cory Petty                    ACCOUNT NO.:  [id number]   MEDICAL RECORD NO.:  [id number]          PATIENT TYPE:  INP   LOCATION:  3102                         FACILITY:  [facility]   PHYSICIAN:  Michael L. Reynolds, M.D.DATE OF BIRTH:  [birth year]   DATE OF ADMISSION:  01/07/2008  DATE OF DISCHARGE:                              HISTORY & PHYSICAL   CHIEF COMPLAINT:  Stroke with left-sided weakness.   HISTORY OF PRESENT ILLNESS:  This is the initial [facility] admission for this 55 year old man with a past medical history  which includes atrial fibrillation on chronic anticoagulation, although  there is some question about compliance with medications.  The patient  was with friends at approximately 9:50 today when he developed sudden  onset of slurred speech and weakness on the left upper and lower  extremities.  EMS was alerted immediately, and he was brought to [facility] where he was found to have a left hemiparesis with slurred  speech.  CT of the head was performed and demonstrated the presence of a  dense right MCA sign.  In conversation with the ER physician at the  [facility], I advised the patient receive two-thirds dose of  intravenous t-PA and to be shipped to [facility] for further  management.  This was undertaken and upon his arrival to the [facility], he was evaluated by the stroke service.  He continues to have  a dense left hemiparesis.  He denies any pain.  He is able to recall the  events surrounding his episode today.  He has never had a previous  similar episode to his knowledge.   Past medical history is remarkable for atrial fibrillation.  He denies  any other chronic medical problems.  He subsequently denies hypertension  or diabetes.   MEDICATIONS:  1. Coumadin.  2. Tenormin 50 mg t.i.d.  3. Digoxin 0.25 mg daily.  4. Crestor.  5. Hydrochlorothiazide p.r.n.  6. Baby aspirin daily.  7. Tylenol  p.r.n.   ALLERGIES:  No known drug allergies.   FAMILY HISTORY:  Negative for stroke.  Both parents died of cancer.   SOCIAL HISTORY:  He smokes a pack of cigarettes a day.  He denies any  history of alcohol or illicit drug use.   REVIEW OF SYSTEMS:  Full 10 review of systems is negative except as  outlined in the HPI and an accompanying admission nursing record.   PHYSICAL EXAMINATION:  VITAL SIGNS:  Temperature is 97.7, blood pressure  is 112/75, pulse 82, respirations 16, and O2 sat 97% on 2 L of O2 nasal  cannula.  GENERAL:  This is an obese, but otherwise, healthy-appearing man supine  in hospital bed in no evident distress.  HEENT:  Head, cranium is normocephalic and atraumatic.  Oropharynx is  benign.  NECK:  Supple without carotid or supraclavicular bruits.  HEART:  Regular rate and rhythm without murmurs.  CHEST:  Clear to auscultation bilaterally.  ABDOMEN:  Obese, soft, and normoactive bowel sounds.  EXTREMITIES:  Some pretibial  edema is noted.  NEUROLOGIC:  Mental status.  He is drowsy, but alerts easily to voice.  He is oriented to place and time.  Recent and remote memory are  adequate.  He is able to name objects and repeat phrases.  His speech is  mild to moderately dysarthric.  Cranial nerves 2-12 are equal and  reactive.  Extraocular movements full without nystagmus.  Visual field  examination reveals a dense left homonymous hemianopsia.  He has  significant drooping of the lower aspect of the left face.  Tongue and  palate move normally symmetrically.  MOTOR:  Normal bulk and tone.  Right is normal.  There is a dense left  hemiparesis with no effort against gravity in the arm or leg.  SENSORY:  He reports no appreciation of light touch or pinprick sensation of the  left upper or lower extremities.  Right is normal.  COORDINATION:  Finger-to-nose performed well on the right.  Cannot be performed in the  left.  GAIT:  Deferred   LABORATORY DATA:  CBC; white  count 9.5, hemoglobin is 15.7, and  platelets 176,000.  BMET is unremarkable.  Creatinine is 0.8.  Coags are  normal with an INR of 1.0.  CT of the head is as above, with a dense  right MCA sign without evidence of acute stroke.   IMPRESSION:  1. Right middle cerebral artery territory stroke with left      hemiparesis, most likely embolic secondary to atrial fibrillation      with subtherapeutic anticoagulation.  2. Atrial fibrillation.   PLAN:  We will take him to the angio suite for further intervention on  his right brain stroke.  This may include intraarterial t-PA, mechanical  thrombectomy, possibly angioplasty and stenting.  After that, he will be  admitted to the ICU and we will proceed with a routine stroke workup.  Stroke Service to follow.       Michael L. Thad Ranger, M.D.  Electronically Signed     MLR/MEDQ  D:  01/07/2008  T:  01/08/2008  Job:  [id number]

## 2011-04-30 LAB — CARDIAC PANEL(CRET KIN+CKTOT+MB+TROPI)
CK, MB: 3.4
CK, MB: 3.5
CK, MB: 3.7
Relative Index: 3 — ABNORMAL HIGH
Relative Index: 3.4 — ABNORMAL HIGH
Total CK: 91
Troponin I: 0.01

## 2011-04-30 LAB — APTT
aPTT: 33
aPTT: 42 — ABNORMAL HIGH

## 2011-04-30 LAB — BLOOD GAS, ARTERIAL
Acid-base deficit: 0.8
Bicarbonate: 23.1
FIO2: 0.5
O2 Saturation: 99.2
PEEP: 5
Patient temperature: 98.6
TCO2: 24.2

## 2011-04-30 LAB — CBC
HCT: 38.4 — ABNORMAL LOW
HCT: 38.7 — ABNORMAL LOW
HCT: 40
Hemoglobin: 13
MCHC: 33.6
MCHC: 34
MCHC: 34.1
MCHC: 34.1
MCHC: 35
MCV: 95.7
MCV: 96.5
MCV: 96.6
MCV: 96.8
MCV: 96.9
Platelets: 157
Platelets: 157
Platelets: 169
Platelets: 176
Platelets: 233
RBC: 3.56 — ABNORMAL LOW
RBC: 3.64 — ABNORMAL LOW
RBC: 3.91 — ABNORMAL LOW
RBC: 3.97 — ABNORMAL LOW
RBC: 3.97 — ABNORMAL LOW
RBC: 4.13 — ABNORMAL LOW
RDW: 13.3
RDW: 13.4
RDW: 13.4
WBC: 14.7 — ABNORMAL HIGH
WBC: 8.4
WBC: 9.5
WBC: 9.6
WBC: 9.7

## 2011-04-30 LAB — PROTIME-INR
INR: 1
INR: 1.1
INR: 1.5
INR: 2.6 — ABNORMAL HIGH
Prothrombin Time: 13.7
Prothrombin Time: 14.7
Prothrombin Time: 18 — ABNORMAL HIGH
Prothrombin Time: 33.1 — ABNORMAL HIGH

## 2011-04-30 LAB — HEPARIN LEVEL (UNFRACTIONATED)
Heparin Unfractionated: 0.27 — ABNORMAL LOW
Heparin Unfractionated: 0.32
Heparin Unfractionated: 0.48
Heparin Unfractionated: <0.1 — ABNORMAL LOW

## 2011-04-30 LAB — BASIC METABOLIC PANEL
BUN: 9
Calcium: 9.1
Chloride: 105
Chloride: 108
Creatinine, Ser: 0.8
GFR calc Af Amer: >60
GFR calc non Af Amer: >60
Potassium: 3.6
Sodium: 138

## 2011-04-30 LAB — COMPREHENSIVE METABOLIC PANEL
ALT: 12
AST: 14
AST: 16
Albumin: 3.2 — ABNORMAL LOW
CO2: 26
Calcium: 8.2 — ABNORMAL LOW
Chloride: 105
Chloride: 107
Creatinine, Ser: 0.71
GFR calc Af Amer: >60
GFR calc Af Amer: >60
GFR calc non Af Amer: >60
Glucose, Bld: 106 — ABNORMAL HIGH
Potassium: 4
Sodium: 139
Total Bilirubin: 0.6
Total Bilirubin: 0.6
Total Protein: 5.5 — ABNORMAL LOW

## 2011-04-30 LAB — LIPID PANEL
Cholesterol: 122
LDL Cholesterol: 80
Total CHOL/HDL Ratio: 3.8
Triglycerides: 52
VLDL: 10

## 2011-04-30 LAB — DIFFERENTIAL
Basophils Relative: 1
Eosinophils Absolute: 0.1
Lymphs Abs: 2.4
Neutro Abs: 5.9
Neutrophils Relative %: 63

## 2011-04-30 LAB — TRIGLYCERIDES
Triglycerides: 64
Triglycerides: 77

## 2011-04-30 LAB — POCT I-STAT 4, (NA,K, GLUC, HGB,HCT): Hemoglobin: 14.6

## 2011-04-30 LAB — CHOLESTEROL, TOTAL: Cholesterol: 140

## 2011-05-04 LAB — BASIC METABOLIC PANEL
BUN: 14
Chloride: 108
Glucose, Bld: 95
Potassium: 4.1

## 2011-05-04 LAB — CBC
HCT: 47.6
MCHC: 33
MCV: 93.9
Platelets: 189
RDW: 13.5

## 2011-05-05 LAB — BASIC METABOLIC PANEL
BUN: 11
CO2: 25
Calcium: 9.1
Creatinine, Ser: 0.92
Glucose, Bld: 98

## 2011-05-05 LAB — CBC
Hemoglobin: 16.1
MCHC: 33.1
RDW: 14.2

## 2011-05-05 LAB — DIFFERENTIAL
Basophils Absolute: 0
Basophils Relative: 0
Monocytes Absolute: 0.5
Neutro Abs: 4.3
Neutrophils Relative %: 59

## 2011-05-05 LAB — APTT: aPTT: 37

## 2012-10-26 ENCOUNTER — Encounter: Payer: Self-pay | Admitting: Pharmacist Clinician (PhC)/ Clinical Pharmacy Specialist

## 2012-10-26 DIAGNOSIS — I4891 Unspecified atrial fibrillation: Secondary | ICD-10-CM

## 2012-10-26 DIAGNOSIS — I482 Chronic atrial fibrillation, unspecified: Secondary | ICD-10-CM | POA: Insufficient documentation

## 2012-10-26 DIAGNOSIS — Z7901 Long term (current) use of anticoagulants: Secondary | ICD-10-CM | POA: Insufficient documentation

## 2012-10-26 DIAGNOSIS — I639 Cerebral infarction, unspecified: Secondary | ICD-10-CM | POA: Insufficient documentation

## 2012-12-29 ENCOUNTER — Telehealth: Payer: Self-pay | Admitting: Cardiovascular Disease

## 2012-12-29 NOTE — Telephone Encounter (Signed)
Labs mailed to patient per his request.

## 2012-12-29 NOTE — Telephone Encounter (Signed)
Please mail his lab results from April to him!

## 2012-12-29 NOTE — Telephone Encounter (Signed)
Message forwarded to W. Waddell, CMA.  

## 2013-01-10 ENCOUNTER — Other Ambulatory Visit: Payer: Self-pay | Admitting: Cardiovascular Disease

## 2013-01-18 ENCOUNTER — Ambulatory Visit (INDEPENDENT_AMBULATORY_CARE_PROVIDER_SITE_OTHER): Payer: Self-pay | Admitting: Pharmacist Clinician (PhC)/ Clinical Pharmacy Specialist

## 2013-01-18 DIAGNOSIS — I6350 Cerebral infarction due to unspecified occlusion or stenosis of unspecified cerebral artery: Secondary | ICD-10-CM

## 2013-01-18 DIAGNOSIS — I4891 Unspecified atrial fibrillation: Secondary | ICD-10-CM

## 2013-01-18 DIAGNOSIS — I639 Cerebral infarction, unspecified: Secondary | ICD-10-CM

## 2013-01-18 DIAGNOSIS — Z7901 Long term (current) use of anticoagulants: Secondary | ICD-10-CM

## 2013-02-02 ENCOUNTER — Other Ambulatory Visit: Payer: Self-pay | Admitting: Cardiovascular Disease

## 2013-02-02 LAB — PROTIME-INR: INR: 2.64 — ABNORMAL HIGH (ref <1.50)

## 2013-02-06 ENCOUNTER — Telehealth: Payer: Self-pay | Admitting: Cardiovascular Disease

## 2013-02-06 NOTE — Telephone Encounter (Signed)
Cory Petty Digoxin has went up in price and wants to know if there is something else he can take that's more reasonable   Thanks

## 2013-02-06 NOTE — Telephone Encounter (Signed)
Message forwarded to W. Waddell, CMA.  Chart# [id number] on desk.

## 2013-03-03 ENCOUNTER — Other Ambulatory Visit: Payer: Self-pay | Admitting: *Deleted

## 2013-03-03 ENCOUNTER — Telehealth: Payer: Self-pay | Admitting: Cardiovascular Disease

## 2013-03-03 DIAGNOSIS — I639 Cerebral infarction, unspecified: Secondary | ICD-10-CM

## 2013-03-03 DIAGNOSIS — I4891 Unspecified atrial fibrillation: Secondary | ICD-10-CM

## 2013-03-03 DIAGNOSIS — Z7901 Long term (current) use of anticoagulants: Secondary | ICD-10-CM

## 2013-03-03 NOTE — Telephone Encounter (Signed)
Lab ordered.  No number to call back.

## 2013-03-03 NOTE — Telephone Encounter (Signed)
Calling because the patient is waiting at the lab. [facility] need an order for his standing PT/INR.Marland Kitchen Thanks

## 2013-03-03 NOTE — Telephone Encounter (Signed)
Standing order for INR has expired.

## 2013-03-20 ENCOUNTER — Telehealth: Payer: Self-pay | Admitting: Cardiovascular Disease

## 2013-03-20 NOTE — Telephone Encounter (Signed)
Mr glendinning says never got his inr results from 1st of month  Please call

## 2013-03-23 NOTE — Telephone Encounter (Signed)
No record at [facility] of August INR, advised pt to go back to lab next week to re-draw

## 2013-04-18 ENCOUNTER — Telehealth: Payer: Self-pay | Admitting: Pharmacist Clinician (PhC)/ Clinical Pharmacy Specialist

## 2013-04-18 NOTE — Telephone Encounter (Signed)
overdue INR letter sent

## 2013-05-02 LAB — PROTIME-INR: INR: 4.4 — AB (ref <=1.1)

## 2013-05-10 ENCOUNTER — Telehealth: Payer: Self-pay | Admitting: Pharmacist Clinician (PhC)/ Clinical Pharmacy Specialist

## 2013-05-10 NOTE — Telephone Encounter (Signed)
Had PT on 9-30,still have not gotten his results.

## 2013-05-10 NOTE — Telephone Encounter (Signed)
Called [facility] main office #, they have no record of INR drawn.  Called pt, advised to go back to lab.

## 2013-05-11 ENCOUNTER — Encounter: Payer: Self-pay | Admitting: Cardiovascular Disease

## 2013-05-11 ENCOUNTER — Ambulatory Visit (INDEPENDENT_AMBULATORY_CARE_PROVIDER_SITE_OTHER): Payer: Medicare Other | Admitting: Pharmacist Clinician (PhC)/ Clinical Pharmacy Specialist

## 2013-05-11 ENCOUNTER — Telehealth: Payer: Self-pay | Admitting: Pharmacist Clinician (PhC)/ Clinical Pharmacy Specialist

## 2013-05-11 DIAGNOSIS — I639 Cerebral infarction, unspecified: Secondary | ICD-10-CM

## 2013-05-11 DIAGNOSIS — Z7901 Long term (current) use of anticoagulants: Secondary | ICD-10-CM

## 2013-05-11 DIAGNOSIS — I6350 Cerebral infarction due to unspecified occlusion or stenosis of unspecified cerebral artery: Secondary | ICD-10-CM

## 2013-05-11 DIAGNOSIS — I4891 Unspecified atrial fibrillation: Secondary | ICD-10-CM

## 2013-05-11 NOTE — Telephone Encounter (Signed)
Please call.

## 2013-05-23 ENCOUNTER — Other Ambulatory Visit: Payer: Self-pay | Admitting: Cardiovascular Disease

## 2013-05-26 ENCOUNTER — Ambulatory Visit (INDEPENDENT_AMBULATORY_CARE_PROVIDER_SITE_OTHER): Payer: Medicare Other | Admitting: Pharmacist Clinician (PhC)/ Clinical Pharmacy Specialist

## 2013-05-26 DIAGNOSIS — Z7901 Long term (current) use of anticoagulants: Secondary | ICD-10-CM

## 2013-05-26 DIAGNOSIS — I639 Cerebral infarction, unspecified: Secondary | ICD-10-CM

## 2013-05-26 DIAGNOSIS — I4891 Unspecified atrial fibrillation: Secondary | ICD-10-CM

## 2013-05-26 DIAGNOSIS — I6350 Cerebral infarction due to unspecified occlusion or stenosis of unspecified cerebral artery: Secondary | ICD-10-CM

## 2013-05-26 NOTE — Telephone Encounter (Signed)
Had pt done on Tuesday and want to know the results.. Please call [phone]

## 2013-06-06 ENCOUNTER — Other Ambulatory Visit: Payer: Self-pay | Admitting: Cardiovascular Disease

## 2013-06-07 ENCOUNTER — Ambulatory Visit (INDEPENDENT_AMBULATORY_CARE_PROVIDER_SITE_OTHER): Payer: Medicare Other | Admitting: Pharmacist Clinician (PhC)/ Clinical Pharmacy Specialist

## 2013-06-07 DIAGNOSIS — Z7901 Long term (current) use of anticoagulants: Secondary | ICD-10-CM

## 2013-06-07 DIAGNOSIS — I4891 Unspecified atrial fibrillation: Secondary | ICD-10-CM

## 2013-06-07 DIAGNOSIS — I6350 Cerebral infarction due to unspecified occlusion or stenosis of unspecified cerebral artery: Secondary | ICD-10-CM

## 2013-06-07 DIAGNOSIS — I639 Cerebral infarction, unspecified: Secondary | ICD-10-CM

## 2013-06-07 NOTE — Telephone Encounter (Signed)
See anti-coag visit 

## 2013-07-05 ENCOUNTER — Other Ambulatory Visit: Payer: Self-pay | Admitting: Cardiovascular Disease

## 2013-07-06 ENCOUNTER — Ambulatory Visit (INDEPENDENT_AMBULATORY_CARE_PROVIDER_SITE_OTHER): Payer: Medicare Other | Admitting: Pharmacist Clinician (PhC)/ Clinical Pharmacy Specialist

## 2013-07-06 ENCOUNTER — Telehealth: Payer: Self-pay | Admitting: Pharmacist Clinician (PhC)/ Clinical Pharmacy Specialist

## 2013-07-06 DIAGNOSIS — Z7901 Long term (current) use of anticoagulants: Secondary | ICD-10-CM

## 2013-07-06 DIAGNOSIS — I4891 Unspecified atrial fibrillation: Secondary | ICD-10-CM

## 2013-07-06 DIAGNOSIS — I639 Cerebral infarction, unspecified: Secondary | ICD-10-CM

## 2013-07-06 DIAGNOSIS — I6350 Cerebral infarction due to unspecified occlusion or stenosis of unspecified cerebral artery: Secondary | ICD-10-CM

## 2013-07-06 NOTE — Telephone Encounter (Signed)
Lab order for standing order faxed to [facility]

## 2013-07-31 ENCOUNTER — Other Ambulatory Visit: Payer: Self-pay | Admitting: Cardiovascular Disease

## 2013-07-31 LAB — PROTIME-INR
INR: 3.14 — ABNORMAL HIGH (ref <1.50)
Prothrombin Time: 31.4 seconds — ABNORMAL HIGH (ref 11.6–15.2)

## 2013-08-04 ENCOUNTER — Ambulatory Visit (INDEPENDENT_AMBULATORY_CARE_PROVIDER_SITE_OTHER): Payer: Medicare Other | Admitting: Pharmacist Clinician (PhC)/ Clinical Pharmacy Specialist

## 2013-08-04 ENCOUNTER — Telehealth: Payer: Self-pay | Admitting: *Deleted

## 2013-08-04 DIAGNOSIS — Z7901 Long term (current) use of anticoagulants: Secondary | ICD-10-CM

## 2013-08-04 DIAGNOSIS — I639 Cerebral infarction, unspecified: Secondary | ICD-10-CM

## 2013-08-04 DIAGNOSIS — I6350 Cerebral infarction due to unspecified occlusion or stenosis of unspecified cerebral artery: Secondary | ICD-10-CM

## 2013-08-04 DIAGNOSIS — I4891 Unspecified atrial fibrillation: Secondary | ICD-10-CM

## 2013-08-04 NOTE — Telephone Encounter (Signed)
Pt called in regards to his PT and he wanted to know if you received the results yet.

## 2013-08-04 NOTE — Telephone Encounter (Signed)
See anticoag note

## 2013-09-05 ENCOUNTER — Other Ambulatory Visit: Payer: Self-pay | Admitting: Cardiovascular Disease

## 2013-09-05 DIAGNOSIS — Z7901 Long term (current) use of anticoagulants: Secondary | ICD-10-CM | POA: Diagnosis not present

## 2013-09-05 LAB — PROTIME-INR
INR: 2.7 — ABNORMAL HIGH (ref <1.50)
PROTHROMBIN TIME: 28 s — AB (ref 11.6–15.2)

## 2013-09-06 ENCOUNTER — Ambulatory Visit (INDEPENDENT_AMBULATORY_CARE_PROVIDER_SITE_OTHER): Payer: Medicare Other | Admitting: Pharmacist Clinician (PhC)/ Clinical Pharmacy Specialist

## 2013-09-06 DIAGNOSIS — I4891 Unspecified atrial fibrillation: Secondary | ICD-10-CM

## 2013-09-06 DIAGNOSIS — I6350 Cerebral infarction due to unspecified occlusion or stenosis of unspecified cerebral artery: Secondary | ICD-10-CM

## 2013-09-06 DIAGNOSIS — I639 Cerebral infarction, unspecified: Secondary | ICD-10-CM

## 2013-09-06 DIAGNOSIS — Z7901 Long term (current) use of anticoagulants: Secondary | ICD-10-CM

## 2013-10-09 ENCOUNTER — Other Ambulatory Visit: Payer: Self-pay | Admitting: Cardiovascular Disease

## 2013-10-09 DIAGNOSIS — Z7901 Long term (current) use of anticoagulants: Secondary | ICD-10-CM | POA: Diagnosis not present

## 2013-10-10 LAB — PROTIME-INR
INR: 2.67 — AB (ref <1.50)
Prothrombin Time: 27.7 seconds — ABNORMAL HIGH (ref 11.6–15.2)

## 2013-10-11 ENCOUNTER — Ambulatory Visit (INDEPENDENT_AMBULATORY_CARE_PROVIDER_SITE_OTHER): Payer: Medicare Other | Admitting: Pharmacist Clinician (PhC)/ Clinical Pharmacy Specialist

## 2013-10-11 DIAGNOSIS — Z7901 Long term (current) use of anticoagulants: Secondary | ICD-10-CM

## 2013-10-11 DIAGNOSIS — I639 Cerebral infarction, unspecified: Secondary | ICD-10-CM

## 2013-10-11 DIAGNOSIS — I4891 Unspecified atrial fibrillation: Secondary | ICD-10-CM

## 2013-10-11 DIAGNOSIS — I6350 Cerebral infarction due to unspecified occlusion or stenosis of unspecified cerebral artery: Secondary | ICD-10-CM

## 2013-10-30 ENCOUNTER — Other Ambulatory Visit: Payer: Self-pay | Admitting: Pharmacist Clinician (PhC)/ Clinical Pharmacy Specialist

## 2013-10-30 MED ORDER — WARFARIN SODIUM 4 MG PO TABS: ORAL_TABLET | ORAL | Status: DC

## 2013-11-07 ENCOUNTER — Other Ambulatory Visit: Payer: Self-pay | Admitting: Cardiovascular Disease

## 2013-11-07 DIAGNOSIS — I4891 Unspecified atrial fibrillation: Secondary | ICD-10-CM | POA: Diagnosis not present

## 2013-11-07 LAB — PROTIME-INR
INR: 2.43 — ABNORMAL HIGH (ref <1.50)
Prothrombin Time: 25.8 seconds — ABNORMAL HIGH (ref 11.6–15.2)

## 2013-11-08 ENCOUNTER — Ambulatory Visit (INDEPENDENT_AMBULATORY_CARE_PROVIDER_SITE_OTHER): Payer: Medicare Other | Admitting: Pharmacist Clinician (PhC)/ Clinical Pharmacy Specialist

## 2013-11-08 DIAGNOSIS — I6350 Cerebral infarction due to unspecified occlusion or stenosis of unspecified cerebral artery: Secondary | ICD-10-CM

## 2013-11-08 DIAGNOSIS — I639 Cerebral infarction, unspecified: Secondary | ICD-10-CM

## 2013-11-08 DIAGNOSIS — Z7901 Long term (current) use of anticoagulants: Secondary | ICD-10-CM

## 2013-11-08 DIAGNOSIS — I4891 Unspecified atrial fibrillation: Secondary | ICD-10-CM

## 2013-11-23 ENCOUNTER — Encounter: Payer: Self-pay | Admitting: Cardiovascular Disease

## 2013-11-23 ENCOUNTER — Ambulatory Visit (INDEPENDENT_AMBULATORY_CARE_PROVIDER_SITE_OTHER): Payer: Medicare Other | Admitting: Cardiovascular Disease

## 2013-11-23 VITALS — BP 120/88 | Ht 76.0 in | Wt 310.1 lb

## 2013-11-23 DIAGNOSIS — E669 Obesity, unspecified: Secondary | ICD-10-CM

## 2013-11-23 DIAGNOSIS — Z7901 Long term (current) use of anticoagulants: Secondary | ICD-10-CM

## 2013-11-23 DIAGNOSIS — I4891 Unspecified atrial fibrillation: Secondary | ICD-10-CM

## 2013-11-23 DIAGNOSIS — I6350 Cerebral infarction due to unspecified occlusion or stenosis of unspecified cerebral artery: Secondary | ICD-10-CM

## 2013-11-23 DIAGNOSIS — I639 Cerebral infarction, unspecified: Secondary | ICD-10-CM

## 2013-11-23 DIAGNOSIS — E782 Mixed hyperlipidemia: Secondary | ICD-10-CM

## 2013-11-23 MED ORDER — DIGOXIN 125 MCG PO TABS: 0.1250 mg | ORAL_TABLET | Freq: Every day | ORAL | Status: DC

## 2013-11-23 MED ORDER — WARFARIN SODIUM 4 MG PO TABS: ORAL_TABLET | ORAL | Status: DC

## 2013-11-23 MED ORDER — ATENOLOL 100 MG PO TABS: 200.0000 mg | ORAL_TABLET | Freq: Every day | ORAL | Status: DC

## 2013-11-23 MED ORDER — DIGOXIN 250 MCG PO TABS: 0.2500 mg | ORAL_TABLET | Freq: Every day | ORAL | Status: DC

## 2013-11-23 MED ORDER — SIMVASTATIN 40 MG PO TABS: 40.0000 mg | ORAL_TABLET | Freq: Every day | ORAL | Status: DC

## 2013-11-23 NOTE — Progress Notes (Signed)
Patient ID: Cory Petty, male   DOB: 28-Apr-1956, 58 y.o.   MRN: [id number]     HPI: Cory Petty is a 58 y.o. male who presents for 1 year cardiology evaluation  [name] has a history of permanent atrial fibrillation and has been on chronic Coumadin therapy.  In June 2097.  A small CVA.  He has ongoing tobacco use, and currently smokes one pack per day.  He also has a history of hyperlipidemia, and moderate obesity.  He did undergo a sleep study in December 2011, which revealed mild increased upper airways resistance syndrome with mild sleep apnea, only during REM sleep and is not on CPAP therapy for this.  He has been on disability since his stroke and after his wife's death.  He is a former Administrator.  Over the past year, [name] denies any change in symptoms.  Firstly, still smokes one pack per day.  He's not had major successful weight loss.  He denies dizziness.  Denies chest pain.  He presents for evaluation.  Past Medical History  Diagnosis Date  . Hypersomnia with sleep apnea     Sleep Study (07/16/2010) AHI-2.3/hr, AHI during REM-9.4/hr, RDI-5.3/hr, REM during REM-12.8/hr, AVG O2 during REM and NREM was 93.0%  . Permanent atrial fibrillation   . CVA (cerebral vascular accident)     Remote, small  . Hyperlipidemia     Past Surgical History  Procedure Laterality Date  . Cerebral angiogram  07/04/2008    Small right anterior temporal arteriovenousmalformation unchanged in configuration or arterial feeders or venous drainage  . Cardiovascular stress test  06/18/2008    Perfusion defect in the inferior and apical myocardial region consistent with diaphragmatic attenuation. Remaining myocardium presents normal myocardial perfusion with no evidence of ischemia or infarct. EKG negative for ischemia.  . Transthoracic echocardiogram  08/01/2010    EF >55%, LA mild-moderately dilated,     No Known Allergies  Current Outpatient Prescriptions  Medication Sig Dispense Refill  .  acetaminophen (TYLENOL) 500 MG tablet Take 500 mg by mouth as needed.      Marland Kitchen aspirin EC 81 MG tablet Take 81 mg by mouth daily.      Marland Kitchen atenolol (TENORMIN) 100 MG tablet Take 2 tablets (200 mg total) by mouth daily.  180 tablet  3  . gabapentin (NEURONTIN) 300 MG capsule Take 300 mg by mouth daily.      . potassium gluconate 595 MG TABS tablet Take 595 mg by mouth daily.      . simvastatin (ZOCOR) 40 MG tablet Take 1 tablet (40 mg total) by mouth daily.  90 tablet  3  . warfarin (COUMADIN) 4 MG tablet Take 1 to 1 & 1/2 tablets by mouth daily or as directed  120 tablet  1  . digoxin (LANOXIN) 0.25 MG tablet Take 1 tablet (0.25 mg total) by mouth daily.  90 tablet  3   No current facility-administered medications for this visit.    History   Social History  . Marital Status: Widowed    Spouse Name: N/A    Number of Children: N/A  . Years of Education: N/A   Occupational History  . Not on file.   Social History Main Topics  . Smoking status: Current Every Day Smoker -- 1.00 packs/day for 29 years    Types: Cigarettes  . Smokeless tobacco: Never Used  . Alcohol Use: Yes     Comment: 2 beers per month  . Drug  Use: Not on file  . Sexual Activity: Not on file   Other Topics Concern  . Not on file   Social History Narrative  . No narrative on file   Socially, he is widowed.  He has no children.  Does smoke tobacco one pack per day.  He is a former Administrator.  Family History  Problem Relation Age of Onset  . Cancer Mother     Lung cancer  . Cancer Father     Stomach cancer  . Cancer Brother     ROS is negative for fevers, chills or night sweats.  He denies change in skin.  He denies change in vision or hearing.  There is no lymphadenopathy.  He's unaware of wheezing.  He denies PND or orthopnea.  He is unaware of tachycardia palpitations.  He denies chest pressure.  He denies nausea, vomiting, or weight loss.  He denies change in bowel or bladder habits.  He is unaware of  bleeding.  He denies claudication.  At times is trace ankle swelling.  He denies significant snoring.  He is unaware of diabetes.  He does have a peripheral neuropathy.  He does have hyperlipidemia. Other comprehensive 14 point system review is negative.  PE BP 120/88  Ht 6' 4"  (1.93 m)  Wt 310 lb 1.6 oz (140.66 kg)  BMI 37.76 kg/m2  General: Alert, oriented, no distress.  Moderately obese Skin: normal turgor, no rashes HEENT: Normocephalic, atraumatic. Pupils round and reactive; sclera anicteric;no lid lag. Extraocular muscles intact;; no xanthelasmas. Nose without nasal septal hypertrophy Mouth/Parynx benign; Mallinpatti scale 3 Neck: No JVD, no carotid bruits; normal carotid upstroke Lungs: clear to ausculatation and percussion; no wheezing or rales Chest wall: no tenderness to palpitation Heart: Irregular irregular rhythm compatible with residual fibrillation with a rate in the 60s to 70s., s1 s2 normal; 1/6 systolic murmur.  no diastolic murmur, rub thrills or heaves Abdomen: soft, nontender; no hepatosplenomehaly, BS+; abdominal aorta nontender and not dilated by palpation. Back: no CVA tenderness Pulses 2+ Extremities: no clubbing cyanosis or edema, Homan's sign negative  Neurologic: grossly nonfocal; cranial nerves grossly normal. Psychologic: normal affect and mood.  ECG (independently read by me): Atrial fibrillation at 67 beats per minute.  Possible LVH by voltage criteria in aVL.  No significant ST-T change.  LABS:  BMET    Component Value Date/Time   NA 139 07/02/2008 1141   K 4.4 07/02/2008 1141   CL 106 07/02/2008 1141   CO2 25 07/02/2008 1141   GLUCOSE 98 07/02/2008 1141   BUN 11 07/02/2008 1141   CREATININE 0.92 07/02/2008 1141   CALCIUM 9.1 07/02/2008 1141   GFRNONAA >60 07/02/2008 1141   GFRAA  Value: >60        The eGFR has been calculated using the MDRD equation. This calculation has not been validated in all clinical 07/02/2008 1141     Hepatic  Function Panel     Component Value Date/Time   PROT 5.4* 01/09/2008 0455   ALBUMIN 2.8* 01/09/2008 0455   AST 14 01/09/2008 0455   ALT 12 01/09/2008 0455   ALKPHOS 71 01/09/2008 0455   BILITOT 0.6 01/09/2008 0455     CBC    Component Value Date/Time   WBC 7.3 07/02/2008 1141   RBC 5.17 07/02/2008 1141   HGB 16.1 07/02/2008 1141   HCT 48.8 07/02/2008 1141   PLT 189 07/02/2008 1141   MCV 94.3 07/02/2008 1141   MCHC 33.1 07/02/2008 1141  RDW 14.2 07/02/2008 1141   LYMPHSABS 2.3 07/02/2008 1141   MONOABS 0.5 07/02/2008 1141   EOSABS 0.1 07/02/2008 1141   BASOSABS 0.0 07/02/2008 1141     BNP No results found for this basename: probnp    Lipid Panel     Component Value Date/Time   CHOL  Value: 122        ATP III CLASSIFICATION:  <200     mg/dL   Desirable  200-239  mg/dL   Borderline High  >=240    mg/dL   High 01/08/2008 0610   TRIG 92 01/16/2008 0710   HDL 32* 01/08/2008 0610   CHOLHDL 3.8 01/08/2008 0610   VLDL 10 01/08/2008 0610   LDLCALC  Value: 80        Total Cholesterol/HDL:CHD Risk Coronary Heart Disease Risk Table                     Men   Women  1/2 Average Risk   3.4   3.3 01/08/2008 0610     RADIOLOGY: No results found.    ASSESSMENT AND PLAN:  Mr. Kiet Geer has a long-standing history of A2 fibrillation, which is permanent and he has been on chronic Coumadin therapy for many years.  He suffered a small CVA in June 2009, consistent time has been on disability.  He continues to have moderate obesity and has not been significantly successful in major weight loss, but has lost approximately 6 pounds since last year.  I suggested he reduce his Lanoxin dose from 0.25 mg to 0.125 mg.  I discussed the importance of smoking cessation.  I will check a complete set of laboratory in a fasting state and I will contact him regarding the results.  If adjustments need to be made to his medical therapy.  At present he will continue his current dose of warfarin and one week ago INR was 2.4.  He  will continue simvastatin for his hyperlipidemia unless laboratory dictates otherwise.  I'll see him in one year for followup evaluation.  Time spent: 25 minutes  Troy Sine, MD, [facility]  11/23/2013 7:34 PM

## 2013-11-23 NOTE — Patient Instructions (Addendum)
Your physician recommends that you schedule a follow-up appointment in: 1 year.  Your physician has recommended you make the following change in your medication: decrease the digoxin to 0.125 mg  Your physician recommends that you return for lab work fasting.

## 2013-11-29 ENCOUNTER — Other Ambulatory Visit: Payer: Self-pay | Admitting: *Deleted

## 2013-11-29 MED ORDER — DIGOXIN 125 MCG PO TABS: 0.1250 mg | ORAL_TABLET | Freq: Every day | ORAL | Status: DC

## 2013-11-29 NOTE — Telephone Encounter (Signed)
Patient requested prescription for digoxin 0.125 to be sent to the pharmacy. He cannot cut the other tablet into 1/2 as previously thought.

## 2013-12-08 ENCOUNTER — Other Ambulatory Visit: Payer: Self-pay | Admitting: Cardiovascular Disease

## 2013-12-08 DIAGNOSIS — Z7901 Long term (current) use of anticoagulants: Secondary | ICD-10-CM | POA: Diagnosis not present

## 2013-12-08 DIAGNOSIS — E782 Mixed hyperlipidemia: Secondary | ICD-10-CM | POA: Diagnosis not present

## 2013-12-08 DIAGNOSIS — I4891 Unspecified atrial fibrillation: Secondary | ICD-10-CM | POA: Diagnosis not present

## 2013-12-08 LAB — COMPREHENSIVE METABOLIC PANEL
ALBUMIN: 4.1 g/dL (ref 3.5–5.2)
ALK PHOS: 90 U/L (ref 39–117)
ALT: 14 U/L (ref 0–53)
AST: 16 U/L (ref 0–37)
BUN: 14 mg/dL (ref 6–23)
CO2: 29 meq/L (ref 19–32)
Calcium: 9.6 mg/dL (ref 8.4–10.5)
Chloride: 104 mEq/L (ref 96–112)
Creat: 1.03 mg/dL (ref 0.50–1.35)
GLUCOSE: 100 mg/dL — AB (ref 70–99)
POTASSIUM: 5 meq/L (ref 3.5–5.3)
Sodium: 140 mEq/L (ref 135–145)
Total Bilirubin: 0.6 mg/dL (ref 0.2–1.2)
Total Protein: 6.9 g/dL (ref 6.0–8.3)

## 2013-12-08 LAB — LIPID PANEL
CHOL/HDL RATIO: 3.3 ratio
CHOLESTEROL: 134 mg/dL (ref 0–200)
HDL: 41 mg/dL (ref >39)
LDL Cholesterol: 82 mg/dL (ref 0–99)
Triglycerides: 56 mg/dL (ref <150)
VLDL: 11 mg/dL (ref 0–40)

## 2013-12-08 LAB — CBC
HCT: 48.6 % (ref 39.0–52.0)
HEMOGLOBIN: 16.5 g/dL (ref 13.0–17.0)
MCH: 31.5 pg (ref 26.0–34.0)
MCHC: 34 g/dL (ref 30.0–36.0)
MCV: 92.9 fL (ref 78.0–100.0)
Platelets: 226 10*3/uL (ref 150–400)
RBC: 5.23 MIL/uL (ref 4.22–5.81)
RDW: 13.7 % (ref 11.5–15.5)
WBC: 5.5 10*3/uL (ref 4.0–10.5)

## 2013-12-08 LAB — TSH: TSH: 2.045 u[IU]/mL (ref 0.350–4.500)

## 2013-12-08 LAB — PROTIME-INR
INR: 3.13 — ABNORMAL HIGH (ref <1.50)
Prothrombin Time: 31.3 seconds — ABNORMAL HIGH (ref 11.6–15.2)

## 2013-12-11 ENCOUNTER — Ambulatory Visit (INDEPENDENT_AMBULATORY_CARE_PROVIDER_SITE_OTHER): Payer: Medicare Other | Admitting: Pharmacist Clinician (PhC)/ Clinical Pharmacy Specialist

## 2013-12-11 DIAGNOSIS — I6350 Cerebral infarction due to unspecified occlusion or stenosis of unspecified cerebral artery: Secondary | ICD-10-CM

## 2013-12-11 DIAGNOSIS — Z7901 Long term (current) use of anticoagulants: Secondary | ICD-10-CM

## 2013-12-11 DIAGNOSIS — I4891 Unspecified atrial fibrillation: Secondary | ICD-10-CM

## 2013-12-11 DIAGNOSIS — I639 Cerebral infarction, unspecified: Secondary | ICD-10-CM

## 2013-12-26 ENCOUNTER — Encounter: Payer: Self-pay | Admitting: *Deleted

## 2014-01-03 ENCOUNTER — Other Ambulatory Visit: Payer: Self-pay | Admitting: Cardiovascular Disease

## 2014-01-03 DIAGNOSIS — Z7901 Long term (current) use of anticoagulants: Secondary | ICD-10-CM | POA: Diagnosis not present

## 2014-01-04 LAB — PROTIME-INR
INR: 2.42 — ABNORMAL HIGH (ref <1.50)
Prothrombin Time: 25.7 seconds — ABNORMAL HIGH (ref 11.6–15.2)

## 2014-01-05 ENCOUNTER — Ambulatory Visit (INDEPENDENT_AMBULATORY_CARE_PROVIDER_SITE_OTHER): Payer: Medicare Other | Admitting: Pharmacist Clinician (PhC)/ Clinical Pharmacy Specialist

## 2014-01-05 DIAGNOSIS — Z7901 Long term (current) use of anticoagulants: Secondary | ICD-10-CM

## 2014-01-05 DIAGNOSIS — I639 Cerebral infarction, unspecified: Secondary | ICD-10-CM

## 2014-01-05 DIAGNOSIS — I4891 Unspecified atrial fibrillation: Secondary | ICD-10-CM

## 2014-01-05 DIAGNOSIS — I6350 Cerebral infarction due to unspecified occlusion or stenosis of unspecified cerebral artery: Secondary | ICD-10-CM

## 2014-01-30 ENCOUNTER — Telehealth: Payer: Self-pay | Admitting: Cardiovascular Disease

## 2014-01-30 DIAGNOSIS — Z7901 Long term (current) use of anticoagulants: Secondary | ICD-10-CM | POA: Diagnosis not present

## 2014-01-30 NOTE — Telephone Encounter (Signed)
Need an order for PT- due to his standing order is for the first of the month.   faxed new order to [phone]

## 2014-01-31 ENCOUNTER — Ambulatory Visit (INDEPENDENT_AMBULATORY_CARE_PROVIDER_SITE_OTHER): Payer: Medicare Other | Admitting: Pharmacist Clinician (PhC)/ Clinical Pharmacy Specialist

## 2014-01-31 DIAGNOSIS — I639 Cerebral infarction, unspecified: Secondary | ICD-10-CM

## 2014-01-31 DIAGNOSIS — I6350 Cerebral infarction due to unspecified occlusion or stenosis of unspecified cerebral artery: Secondary | ICD-10-CM

## 2014-01-31 DIAGNOSIS — Z7901 Long term (current) use of anticoagulants: Secondary | ICD-10-CM

## 2014-01-31 LAB — PROTIME-INR
INR: 3.12 — ABNORMAL HIGH (ref <1.50)
Prothrombin Time: 32.1 seconds — ABNORMAL HIGH (ref 11.6–15.2)

## 2014-02-06 DIAGNOSIS — Z0000 Encounter for general adult medical examination without abnormal findings: Secondary | ICD-10-CM | POA: Diagnosis not present

## 2014-03-05 ENCOUNTER — Other Ambulatory Visit: Payer: Self-pay | Admitting: Cardiovascular Disease

## 2014-03-05 DIAGNOSIS — Z7901 Long term (current) use of anticoagulants: Secondary | ICD-10-CM | POA: Diagnosis not present

## 2014-03-06 LAB — PROTIME-INR
INR: 2.44 — ABNORMAL HIGH (ref <1.50)
PROTHROMBIN TIME: 26.5 s — AB (ref 11.6–15.2)

## 2014-03-08 ENCOUNTER — Ambulatory Visit (INDEPENDENT_AMBULATORY_CARE_PROVIDER_SITE_OTHER): Payer: Medicare Other | Admitting: Pharmacist Clinician (PhC)/ Clinical Pharmacy Specialist

## 2014-03-08 DIAGNOSIS — Z7901 Long term (current) use of anticoagulants: Secondary | ICD-10-CM

## 2014-03-08 DIAGNOSIS — I6350 Cerebral infarction due to unspecified occlusion or stenosis of unspecified cerebral artery: Secondary | ICD-10-CM

## 2014-03-08 DIAGNOSIS — I639 Cerebral infarction, unspecified: Secondary | ICD-10-CM

## 2014-03-19 ENCOUNTER — Telehealth: Payer: Self-pay | Admitting: Pharmacist Clinician (PhC)/ Clinical Pharmacy Specialist

## 2014-03-19 NOTE — Telephone Encounter (Signed)
Spoke with patient, we had incorrect cell number.

## 2014-03-19 NOTE — Telephone Encounter (Signed)
Patient needs protime results

## 2014-04-05 ENCOUNTER — Other Ambulatory Visit: Payer: Self-pay | Admitting: Cardiovascular Disease

## 2014-04-05 DIAGNOSIS — Z7901 Long term (current) use of anticoagulants: Secondary | ICD-10-CM | POA: Diagnosis not present

## 2014-04-06 ENCOUNTER — Ambulatory Visit (INDEPENDENT_AMBULATORY_CARE_PROVIDER_SITE_OTHER): Payer: Medicare Other | Admitting: Pharmacist Clinician (PhC)/ Clinical Pharmacy Specialist

## 2014-04-06 DIAGNOSIS — I6350 Cerebral infarction due to unspecified occlusion or stenosis of unspecified cerebral artery: Secondary | ICD-10-CM

## 2014-04-06 DIAGNOSIS — Z7901 Long term (current) use of anticoagulants: Secondary | ICD-10-CM

## 2014-04-06 DIAGNOSIS — I639 Cerebral infarction, unspecified: Secondary | ICD-10-CM

## 2014-04-06 LAB — PROTIME-INR
INR: 2.3 — AB (ref <1.50)
Prothrombin Time: 25.3 seconds — ABNORMAL HIGH (ref 11.6–15.2)

## 2014-04-21 ENCOUNTER — Encounter: Payer: Self-pay | Admitting: Cardiovascular Disease

## 2014-05-04 ENCOUNTER — Telehealth: Payer: Self-pay | Admitting: Cardiovascular Disease

## 2014-05-04 ENCOUNTER — Other Ambulatory Visit: Payer: Self-pay | Admitting: Cardiovascular Disease

## 2014-05-04 ENCOUNTER — Other Ambulatory Visit: Payer: Self-pay | Admitting: *Deleted

## 2014-05-04 DIAGNOSIS — Z7901 Long term (current) use of anticoagulants: Secondary | ICD-10-CM | POA: Diagnosis not present

## 2014-05-04 LAB — PROTIME-INR
INR: 2.24 — AB (ref <1.50)
PROTHROMBIN TIME: 24.8 s — AB (ref 11.6–15.2)

## 2014-05-04 NOTE — Telephone Encounter (Signed)
[facility] called requesting a renewal of patient's standing  INR order. Order updated.

## 2014-05-10 ENCOUNTER — Ambulatory Visit (INDEPENDENT_AMBULATORY_CARE_PROVIDER_SITE_OTHER): Payer: Medicare Other | Admitting: Pharmacist Clinician (PhC)/ Clinical Pharmacy Specialist

## 2014-05-10 DIAGNOSIS — I639 Cerebral infarction, unspecified: Secondary | ICD-10-CM

## 2014-06-04 ENCOUNTER — Other Ambulatory Visit: Payer: Self-pay | Admitting: Cardiovascular Disease

## 2014-06-04 ENCOUNTER — Telehealth: Payer: Self-pay | Admitting: *Deleted

## 2014-06-04 DIAGNOSIS — Z7901 Long term (current) use of anticoagulants: Secondary | ICD-10-CM | POA: Diagnosis not present

## 2014-06-04 DIAGNOSIS — Z23 Encounter for immunization: Secondary | ICD-10-CM | POA: Diagnosis not present

## 2014-06-04 NOTE — Telephone Encounter (Signed)
Faxed standing orders to [phone]

## 2014-06-04 NOTE — Telephone Encounter (Signed)
Ok to renew?  

## 2014-06-04 NOTE — Telephone Encounter (Signed)
Received call from [facility] with [place]. They faxed over request for renewal of standing PT/INR orders for patient and need them signed and sent back in. They faxed to [phone]

## 2014-06-05 LAB — PROTIME-INR
INR: 2.78 — ABNORMAL HIGH (ref <1.50)
Prothrombin Time: 29.3 seconds — ABNORMAL HIGH (ref 11.6–15.2)

## 2014-06-07 ENCOUNTER — Ambulatory Visit (INDEPENDENT_AMBULATORY_CARE_PROVIDER_SITE_OTHER): Payer: Medicare Other | Admitting: Pharmacist Clinician (PhC)/ Clinical Pharmacy Specialist

## 2014-06-07 DIAGNOSIS — I639 Cerebral infarction, unspecified: Secondary | ICD-10-CM

## 2014-07-05 ENCOUNTER — Other Ambulatory Visit: Payer: Self-pay | Admitting: Cardiovascular Disease

## 2014-07-05 DIAGNOSIS — Z7901 Long term (current) use of anticoagulants: Secondary | ICD-10-CM | POA: Diagnosis not present

## 2014-07-06 ENCOUNTER — Ambulatory Visit (INDEPENDENT_AMBULATORY_CARE_PROVIDER_SITE_OTHER): Payer: Medicare Other | Admitting: Pharmacist Clinician (PhC)/ Clinical Pharmacy Specialist

## 2014-07-06 DIAGNOSIS — I639 Cerebral infarction, unspecified: Secondary | ICD-10-CM

## 2014-07-06 LAB — PROTIME-INR
INR: 2.29 — ABNORMAL HIGH (ref <1.50)
Prothrombin Time: 25.2 seconds — ABNORMAL HIGH (ref 11.6–15.2)

## 2014-08-06 ENCOUNTER — Other Ambulatory Visit: Payer: Self-pay | Admitting: Cardiovascular Disease

## 2014-08-06 DIAGNOSIS — Z7901 Long term (current) use of anticoagulants: Secondary | ICD-10-CM | POA: Diagnosis not present

## 2014-08-07 ENCOUNTER — Ambulatory Visit (INDEPENDENT_AMBULATORY_CARE_PROVIDER_SITE_OTHER): Payer: Medicare Other | Admitting: Pharmacist Clinician (PhC)/ Clinical Pharmacy Specialist

## 2014-08-07 DIAGNOSIS — I639 Cerebral infarction, unspecified: Secondary | ICD-10-CM

## 2014-08-07 LAB — PROTIME-INR
INR: 1.95 — ABNORMAL HIGH (ref <1.50)
PROTHROMBIN TIME: 22.2 s — AB (ref 11.6–15.2)

## 2014-09-01 ENCOUNTER — Other Ambulatory Visit: Payer: Self-pay | Admitting: Cardiovascular Disease

## 2014-09-06 ENCOUNTER — Other Ambulatory Visit: Payer: Self-pay | Admitting: Cardiovascular Disease

## 2014-09-06 DIAGNOSIS — Z7901 Long term (current) use of anticoagulants: Secondary | ICD-10-CM | POA: Diagnosis not present

## 2014-09-06 LAB — PROTIME-INR
INR: 2.32 — AB (ref <1.50)
Prothrombin Time: 25.5 seconds — ABNORMAL HIGH (ref 11.6–15.2)

## 2014-09-07 ENCOUNTER — Ambulatory Visit (INDEPENDENT_AMBULATORY_CARE_PROVIDER_SITE_OTHER): Payer: Medicare Other | Admitting: Pharmacist Clinician (PhC)/ Clinical Pharmacy Specialist

## 2014-09-07 DIAGNOSIS — I639 Cerebral infarction, unspecified: Secondary | ICD-10-CM

## 2014-10-04 ENCOUNTER — Other Ambulatory Visit: Payer: Self-pay | Admitting: Cardiovascular Disease

## 2014-10-04 DIAGNOSIS — Z7901 Long term (current) use of anticoagulants: Secondary | ICD-10-CM | POA: Diagnosis not present

## 2014-10-05 ENCOUNTER — Ambulatory Visit (INDEPENDENT_AMBULATORY_CARE_PROVIDER_SITE_OTHER): Payer: Medicare Other | Admitting: Pharmacist Clinician (PhC)/ Clinical Pharmacy Specialist

## 2014-10-05 DIAGNOSIS — I639 Cerebral infarction, unspecified: Secondary | ICD-10-CM

## 2014-10-05 LAB — PROTIME-INR
INR: 2.74 — ABNORMAL HIGH (ref <1.50)
Prothrombin Time: 29 seconds — ABNORMAL HIGH (ref 11.6–15.2)

## 2014-10-19 ENCOUNTER — Telehealth: Payer: Self-pay | Admitting: Cardiovascular Disease

## 2014-10-22 NOTE — Telephone Encounter (Signed)
Closed encounter °

## 2014-11-08 ENCOUNTER — Other Ambulatory Visit: Payer: Self-pay | Admitting: Cardiovascular Disease

## 2014-11-08 DIAGNOSIS — Z7901 Long term (current) use of anticoagulants: Secondary | ICD-10-CM | POA: Diagnosis not present

## 2014-11-09 ENCOUNTER — Ambulatory Visit (INDEPENDENT_AMBULATORY_CARE_PROVIDER_SITE_OTHER): Payer: Medicare Other | Admitting: Pharmacist Clinician (PhC)/ Clinical Pharmacy Specialist

## 2014-11-09 DIAGNOSIS — I639 Cerebral infarction, unspecified: Secondary | ICD-10-CM

## 2014-11-09 LAB — PROTIME-INR
INR: 2.59 — AB (ref <1.50)
PROTHROMBIN TIME: 27.8 s — AB (ref 11.6–15.2)

## 2014-11-22 ENCOUNTER — Ambulatory Visit (INDEPENDENT_AMBULATORY_CARE_PROVIDER_SITE_OTHER): Payer: Medicare Other | Admitting: Cardiovascular Disease

## 2014-11-22 ENCOUNTER — Other Ambulatory Visit: Payer: Self-pay | Admitting: Cardiovascular Disease

## 2014-11-22 ENCOUNTER — Encounter: Payer: Self-pay | Admitting: Cardiovascular Disease

## 2014-11-22 VITALS — BP 110/76 | HR 59 | Ht 76.0 in | Wt 318.9 lb

## 2014-11-22 DIAGNOSIS — I639 Cerebral infarction, unspecified: Secondary | ICD-10-CM | POA: Diagnosis not present

## 2014-11-22 DIAGNOSIS — Z79899 Other long term (current) drug therapy: Secondary | ICD-10-CM | POA: Diagnosis not present

## 2014-11-22 DIAGNOSIS — Z7901 Long term (current) use of anticoagulants: Secondary | ICD-10-CM | POA: Diagnosis not present

## 2014-11-22 DIAGNOSIS — I482 Chronic atrial fibrillation, unspecified: Secondary | ICD-10-CM

## 2014-11-22 DIAGNOSIS — E782 Mixed hyperlipidemia: Secondary | ICD-10-CM | POA: Insufficient documentation

## 2014-11-22 DIAGNOSIS — E669 Obesity, unspecified: Secondary | ICD-10-CM

## 2014-11-22 DIAGNOSIS — E785 Hyperlipidemia, unspecified: Secondary | ICD-10-CM

## 2014-11-22 MED ORDER — DIGOXIN 125 MCG PO TABS: 0.1250 mg | ORAL_TABLET | Freq: Every day | ORAL | Status: DC

## 2014-11-22 MED ORDER — SIMVASTATIN 40 MG PO TABS: 40.0000 mg | ORAL_TABLET | Freq: Every day | ORAL | Status: DC

## 2014-11-22 MED ORDER — WARFARIN SODIUM 4 MG PO TABS: ORAL_TABLET | ORAL | Status: DC

## 2014-11-22 MED ORDER — ATENOLOL 100 MG PO TABS: 200.0000 mg | ORAL_TABLET | Freq: Every day | ORAL | Status: DC

## 2014-11-22 NOTE — Progress Notes (Signed)
Patient ID: Cory Petty, male   DOB: Dec 10, 1955, 59 y.o.   MRN: [id number]     HPI: Cory Petty is a 59 y.o. male who presents for 1 year cardiology evaluation  [name] has a history of permanent atrial fibrillation and has been on chronic Coumadin therapy.  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  He has a long-standing history of tobacco use and currently is still smoking 1 pack per day. He has a history of hyperlipidemia, and moderate obesity.  A sleep study in December 2011 revealed mild increased upper airways resistance syndrome with mild sleep apnea, only during REM sleep and he is not on CPAP therapy.  He has been on disability since his stroke and after his wife's death.  He is a former Administrator.  Over the past year, [name] denies any change in symptoms.   He walks a aproximately  3000 steps  For 3-4 days per week.  He is unaware of any chest pressure.  He denies shortness of breath.  He is unaware of palpitations.  He is regular in getting his Coumadin checked.  His most recent INR from November 08 2014 was therapeutic at 2.59.  He presents for one-year evaluation.   Past Medical History  Diagnosis Date  . Hypersomnia with sleep apnea     Sleep Study (07/16/2010) AHI-2.3/hr, AHI during REM-9.4/hr, RDI-5.3/hr, REM during REM-12.8/hr, AVG O2 during REM and NREM was 93.0%  . Permanent atrial fibrillation   . CVA (cerebral vascular accident)     Remote, small  . Hyperlipidemia     Past Surgical History  Procedure Laterality Date  . Cerebral angiogram  07/04/2008    Small right anterior temporal arteriovenousmalformation unchanged in configuration or arterial feeders or venous drainage  . Cardiovascular stress test  06/18/2008    Perfusion defect in the inferior and apical myocardial region consistent with diaphragmatic attenuation. Remaining myocardium presents normal myocardial perfusion with no evidence of ischemia or infarct. EKG  negative for ischemia.  . Transthoracic echocardiogram  08/01/2010    EF >55%, LA mild-moderately dilated,     No Known Allergies  Current Outpatient Prescriptions  Medication Sig Dispense Refill  . acetaminophen (TYLENOL) 500 MG tablet Take 500 mg by mouth as needed.    Marland Kitchen aspirin EC 81 MG tablet Take 81 mg by mouth daily.    Marland Kitchen atenolol (TENORMIN) 100 MG tablet Take 2 tablets (200 mg total) by mouth daily. 180 tablet 3  . digoxin (LANOXIN) 0.125 MG tablet Take 1 tablet (0.125 mg total) by mouth daily. 90 tablet 3  . gabapentin (NEURONTIN) 300 MG capsule Take 300 mg by mouth daily.    . potassium gluconate 595 MG TABS tablet Take 595 mg by mouth daily.    . simvastatin (ZOCOR) 40 MG tablet Take 1 tablet (40 mg total) by mouth daily. 90 tablet 3  . warfarin (COUMADIN) 4 MG tablet Take 1 to 1.5 tablets by mouth daily as directed by coumadin clinic 120 tablet 1   No current facility-administered medications for this visit.    History   Social History  . Marital Status: Widowed    Spouse Name: N/A  . Number of Children: N/A  . Years of Education: N/A   Occupational History  . Not on file.   Social History Main Topics  . Smoking status: Current Every Day Smoker -- 1.00 packs/day for 29 years  Types: Cigarettes  . Smokeless tobacco: Never Used  . Alcohol Use: Yes     Comment: 2 beers per month  . Drug Use: Not on file  . Sexual Activity: Not on file   Other Topics Concern  . Not on file   Social History Narrative   Socially, he is widowed.  He has no children.  Does smoke tobacco one pack per day.  He is a former Administrator.  Family History  Problem Relation Age of Onset  . Cancer Mother     Lung cancer  . Cancer Father     Stomach cancer  . Cancer Brother     ROS General: Negative; No fevers, chills, or night sweats;  Positive for obesity HEENT: Negative; No changes in vision or hearing, sinus congestion, difficulty swallowing Pulmonary: Negative; No cough,  wheezing, shortness of breath, hemoptysis Cardiovascular: Negative; No chest pain, presyncope, syncope, palpitations GI: Negative; No nausea, vomiting, diarrhea, or abdominal pain GU: Negative; No dysuria, hematuria, or difficulty voiding Musculoskeletal: Negative; no myalgias, joint pain, or weakness Hematologic/Oncology: Negative; no easy bruising, bleeding Endocrine: Negative; no heat/cold intolerance; no diabetes Neuro: Negative; no changes in balance, headaches Skin: Negative; No rashes or skin lesions Psychiatric: Negative; No behavioral problems, depression Sleep: Negative; No snoring, daytime sleepiness, hypersomnolence, bruxism, restless legs, hypnogognic hallucinations, no cataplexy Other comprehensive 14 point system review is negative.  PE BP 110/76 mmHg  Pulse 59  Ht 6' 4"  (1.93 m)  Wt 318 lb 14.4 oz (144.652 kg)  BMI 38.83 kg/m2  General: Alert, oriented, no distress.  Skin: normal turgor, no rashes HEENT: Normocephalic, atraumatic. Pupils round and reactive; sclera anicteric;no lid lag. Extraocular muscles intact;; no xanthelasmas. Nose without nasal septal hypertrophy Mouth/Parynx benign; Mallinpatti scale 3 Neck: No JVD, no carotid bruits; normal carotid upstroke Lungs: clear to ausculatation and percussion; no wheezing or rales Chest wall: no tenderness to palpitation Heart: Irregular irregular rhythm compatible with residual fibrillation with a rate in the 60s to 70s., s1 s2 normal; 1/6 systolic murmur.  no diastolic murmur, rub thrills or heaves Abdomen:  Moderate central adiposity; soft, nontender; no hepatosplenomehaly, BS+; abdominal aorta nontender and not dilated by palpation. Back: no CVA tenderness Pulses 2+ Extremities:  Trivial ankle edema ;no clubbing or cyanosis, Homan's sign negative  Neurologic: grossly nonfocal; cranial nerves grossly normal. Psychologic: normal affect and mood.   ECG (independently read by me):  Atrial fibrillation with a  controlled ventricular response at 59 bpm.  Normal intervals.   April 2015ECG (independently read by me): Atrial fibrillation at 67 beats per minute.  Possible LVH by voltage criteria in aVL.  No significant ST-T change.  LABS:  BMP Latest Ref Rng 12/08/2013 07/02/2008 05/25/2008  Glucose 70 - 99 mg/dL 100(H) 98 95  BUN 6 - 23 mg/dL 14 11 14   Creatinine 0.50 - 1.35 mg/dL 1.03 0.92 0.79  Sodium 135 - 145 mEq/L 140 139 138  Potassium 3.5 - 5.3 mEq/L 5.0 4.4 4.1  Chloride 96 - 112 mEq/L 104 106 108  CO2 19 - 32 mEq/L 29 25 23   Calcium 8.4 - 10.5 mg/dL 9.6 9.1 8.9    Hepatic Function Latest Ref Rng 12/08/2013 01/09/2008 01/07/2008  Total Protein 6.0 - 8.3 g/dL 6.9 5.4(L) 5.5(L)  Albumin 3.5 - 5.2 g/dL 4.1 2.8(L) 3.2(L)  AST 0 - 37 U/L 16 14 16   ALT 0 - 53 U/L 14 12 13   Alk Phosphatase 39 - 117 U/L 90 71 88  Total Bilirubin 0.2 -  1.2 mg/dL 0.6 0.6 0.6    CBC Latest Ref Rng 12/08/2013 07/02/2008 05/25/2008  WBC 4.0 - 10.5 K/uL 5.5 7.3 6.8  Hemoglobin 13.0 - 17.0 g/dL 16.5 16.1 15.7  Hematocrit 39.0 - 52.0 % 48.6 48.8 47.6  Platelets 150 - 400 K/uL 226 189 189   Lab Results  Component Value Date   TSH 2.045 12/08/2013    BNP No results found for: PROBNP  Lipid Panel     Component Value Date/Time   CHOL 134 12/08/2013 0903   TRIG 56 12/08/2013 0903   HDL 41 12/08/2013 0903   CHOLHDL 3.3 12/08/2013 0903   VLDL 11 12/08/2013 0903   LDLCALC 82 12/08/2013 0903     RADIOLOGY: No results found.    ASSESSMENT AND PLAN:  Mr. Reagan Klemz  Is a 59 year old gentleman with a long-standing history of permanent atrial fibrillation for which he is on  chronic Coumadin therapy,  He suffered a small CVA in June 2009,  And since that time, hehas been on disability.   Since I last saw him one year ago, he has gained weight..  His body mass index is now almost 39 and he is approaching morbid obesity.  We discussed increased exercise for a minimum of 5 days per week and 30 minutes of moderate  intensity.  It is been a year since laboratory has been assessed  I will recheck a complete set of blood work. His atrial fibrillation rate is controlled on atenolol  200 mg daily,  And he continues to take low-dose Lanoxin.He's not having bleeding issues. He has a history of hyperlipidemia and is on simvastatin 40 mg with LDL 1 year ago at 27. I had a long discussion with him concerning the importance of smoking cessation.  He continues to take Neurontin 300 mg for peripheral neuropathy.  I will contact him regarding his blood work results.  Weight loss was recommended as was smoking cessation.  I will see him in one year for reevaluation.  Time spent: 25 minutes  Troy Sine, MD, [facility]  11/22/2014 11:55 AM

## 2014-11-22 NOTE — Patient Instructions (Addendum)
Your physician recommends that you return for lab work fasting.  Your physician wants you to follow-up in: 1 year or sooner if needed with Dr. Claiborne Billings. You will receive a reminder letter in the mail two months in advance. If you don't receive a letter, please call our office to schedule the follow-up appointment.

## 2014-11-23 ENCOUNTER — Telehealth: Payer: Self-pay | Admitting: *Deleted

## 2014-11-23 NOTE — Telephone Encounter (Signed)
Faxed standing PT /INR order to [facility].

## 2014-11-26 ENCOUNTER — Ambulatory Visit: Payer: Medicare Other | Admitting: Cardiovascular Disease

## 2014-12-04 ENCOUNTER — Other Ambulatory Visit: Payer: Self-pay | Admitting: Cardiovascular Disease

## 2014-12-04 DIAGNOSIS — I482 Chronic atrial fibrillation: Secondary | ICD-10-CM | POA: Diagnosis not present

## 2014-12-04 DIAGNOSIS — I4891 Unspecified atrial fibrillation: Secondary | ICD-10-CM | POA: Diagnosis not present

## 2014-12-04 DIAGNOSIS — Z7901 Long term (current) use of anticoagulants: Secondary | ICD-10-CM | POA: Diagnosis not present

## 2014-12-04 DIAGNOSIS — Z79899 Other long term (current) drug therapy: Secondary | ICD-10-CM | POA: Diagnosis not present

## 2014-12-05 ENCOUNTER — Ambulatory Visit (INDEPENDENT_AMBULATORY_CARE_PROVIDER_SITE_OTHER): Payer: Medicare Other | Admitting: Pharmacist Clinician (PhC)/ Clinical Pharmacy Specialist

## 2014-12-05 DIAGNOSIS — I639 Cerebral infarction, unspecified: Secondary | ICD-10-CM

## 2014-12-05 DIAGNOSIS — Z7901 Long term (current) use of anticoagulants: Secondary | ICD-10-CM

## 2014-12-05 LAB — COMPREHENSIVE METABOLIC PANEL
ALBUMIN: 3.9 g/dL (ref 3.5–5.2)
ALT: 13 U/L (ref 0–53)
AST: 16 U/L (ref 0–37)
Alkaline Phosphatase: 75 U/L (ref 39–117)
BILIRUBIN TOTAL: 0.7 mg/dL (ref 0.2–1.2)
BUN: 13 mg/dL (ref 6–23)
CO2: 26 mEq/L (ref 19–32)
Calcium: 8.8 mg/dL (ref 8.4–10.5)
Chloride: 105 mEq/L (ref 96–112)
Creat: 0.89 mg/dL (ref 0.50–1.35)
GLUCOSE: 80 mg/dL (ref 70–99)
POTASSIUM: 4.9 meq/L (ref 3.5–5.3)
SODIUM: 140 meq/L (ref 135–145)
Total Protein: 6.8 g/dL (ref 6.0–8.3)

## 2014-12-05 LAB — TSH: TSH: 1.24 u[IU]/mL (ref 0.350–4.500)

## 2014-12-05 LAB — CBC
HCT: 48.7 % (ref 39.0–52.0)
HEMOGLOBIN: 16.5 g/dL (ref 13.0–17.0)
MCH: 32.8 pg (ref 26.0–34.0)
MCHC: 33.9 g/dL (ref 30.0–36.0)
MCV: 96.8 fL (ref 78.0–100.0)
MPV: 10.8 fL (ref 8.6–12.4)
PLATELETS: 196 10*3/uL (ref 150–400)
RBC: 5.03 MIL/uL (ref 4.22–5.81)
RDW: 13.7 % (ref 11.5–15.5)
WBC: 7 10*3/uL (ref 4.0–10.5)

## 2014-12-05 LAB — PROTIME-INR
INR: 2.35 — ABNORMAL HIGH (ref <1.50)
PROTHROMBIN TIME: 25.7 s — AB (ref 11.6–15.2)

## 2014-12-05 LAB — LIPID PANEL
Cholesterol: 122 mg/dL (ref 0–200)
HDL: 42 mg/dL (ref >=40)
LDL CALC: 66 mg/dL (ref 0–99)
Total CHOL/HDL Ratio: 2.9 Ratio
Triglycerides: 70 mg/dL (ref <150)
VLDL: 14 mg/dL (ref 0–40)

## 2015-01-08 ENCOUNTER — Other Ambulatory Visit: Payer: Self-pay | Admitting: Cardiovascular Disease

## 2015-01-08 DIAGNOSIS — Z7901 Long term (current) use of anticoagulants: Secondary | ICD-10-CM | POA: Diagnosis not present

## 2015-01-09 LAB — PROTIME-INR
INR: 2.25 — AB (ref <1.50)
Prothrombin Time: 24.9 seconds — ABNORMAL HIGH (ref 11.6–15.2)

## 2015-01-11 ENCOUNTER — Ambulatory Visit (INDEPENDENT_AMBULATORY_CARE_PROVIDER_SITE_OTHER): Payer: Medicare Other | Admitting: Pharmacist Clinician (PhC)/ Clinical Pharmacy Specialist

## 2015-01-11 DIAGNOSIS — Z7901 Long term (current) use of anticoagulants: Secondary | ICD-10-CM

## 2015-01-11 DIAGNOSIS — I639 Cerebral infarction, unspecified: Secondary | ICD-10-CM | POA: Diagnosis not present

## 2015-02-01 ENCOUNTER — Other Ambulatory Visit: Payer: Self-pay | Admitting: Cardiovascular Disease

## 2015-02-01 DIAGNOSIS — Z7901 Long term (current) use of anticoagulants: Secondary | ICD-10-CM | POA: Diagnosis not present

## 2015-02-02 LAB — PROTIME-INR
INR: 1.93 — AB (ref <1.50)
Prothrombin Time: 22.1 seconds — ABNORMAL HIGH (ref 11.6–15.2)

## 2015-02-05 ENCOUNTER — Ambulatory Visit (INDEPENDENT_AMBULATORY_CARE_PROVIDER_SITE_OTHER): Payer: Medicare Other | Admitting: Pharmacist Clinician (PhC)/ Clinical Pharmacy Specialist

## 2015-02-05 DIAGNOSIS — I639 Cerebral infarction, unspecified: Secondary | ICD-10-CM

## 2015-02-05 DIAGNOSIS — Z7901 Long term (current) use of anticoagulants: Secondary | ICD-10-CM

## 2015-02-12 DIAGNOSIS — Z79899 Other long term (current) drug therapy: Secondary | ICD-10-CM | POA: Diagnosis not present

## 2015-02-12 DIAGNOSIS — E785 Hyperlipidemia, unspecified: Secondary | ICD-10-CM | POA: Diagnosis not present

## 2015-02-12 DIAGNOSIS — Z125 Encounter for screening for malignant neoplasm of prostate: Secondary | ICD-10-CM | POA: Diagnosis not present

## 2015-02-12 DIAGNOSIS — Z0000 Encounter for general adult medical examination without abnormal findings: Secondary | ICD-10-CM | POA: Diagnosis not present

## 2015-03-06 ENCOUNTER — Other Ambulatory Visit: Payer: Self-pay | Admitting: Cardiovascular Disease

## 2015-03-06 DIAGNOSIS — Z7901 Long term (current) use of anticoagulants: Secondary | ICD-10-CM | POA: Diagnosis not present

## 2015-03-07 ENCOUNTER — Ambulatory Visit (INDEPENDENT_AMBULATORY_CARE_PROVIDER_SITE_OTHER): Payer: Medicare Other | Admitting: Pharmacist Clinician (PhC)/ Clinical Pharmacy Specialist

## 2015-03-07 DIAGNOSIS — I639 Cerebral infarction, unspecified: Secondary | ICD-10-CM

## 2015-03-07 DIAGNOSIS — Z7901 Long term (current) use of anticoagulants: Secondary | ICD-10-CM

## 2015-03-07 LAB — PROTIME-INR
INR: 2.38 — ABNORMAL HIGH (ref <1.50)
Prothrombin Time: 26 seconds — ABNORMAL HIGH (ref 11.6–15.2)

## 2015-05-06 ENCOUNTER — Other Ambulatory Visit: Payer: Self-pay | Admitting: Cardiovascular Disease

## 2015-05-06 DIAGNOSIS — Z7901 Long term (current) use of anticoagulants: Secondary | ICD-10-CM | POA: Diagnosis not present

## 2015-05-07 LAB — PROTIME-INR
INR: 2.6 — ABNORMAL HIGH (ref <1.50)
Prothrombin Time: 28.2 seconds — ABNORMAL HIGH (ref 11.6–15.2)

## 2015-05-10 ENCOUNTER — Ambulatory Visit (INDEPENDENT_AMBULATORY_CARE_PROVIDER_SITE_OTHER): Payer: Medicare Other | Admitting: Pharmacist Clinician (PhC)/ Clinical Pharmacy Specialist

## 2015-05-10 DIAGNOSIS — Z7901 Long term (current) use of anticoagulants: Secondary | ICD-10-CM

## 2015-05-30 DIAGNOSIS — Z23 Encounter for immunization: Secondary | ICD-10-CM | POA: Diagnosis not present

## 2015-06-06 ENCOUNTER — Other Ambulatory Visit: Payer: Self-pay | Admitting: Cardiovascular Disease

## 2015-06-06 DIAGNOSIS — Z7901 Long term (current) use of anticoagulants: Secondary | ICD-10-CM | POA: Diagnosis not present

## 2015-06-07 LAB — PROTIME-INR
INR: 2.57 — AB (ref <1.50)
PROTHROMBIN TIME: 28 s — AB (ref 11.6–15.2)

## 2015-06-10 ENCOUNTER — Ambulatory Visit (INDEPENDENT_AMBULATORY_CARE_PROVIDER_SITE_OTHER): Payer: Medicare Other | Admitting: Pharmacist Clinician (PhC)/ Clinical Pharmacy Specialist

## 2015-06-10 DIAGNOSIS — Z7901 Long term (current) use of anticoagulants: Secondary | ICD-10-CM

## 2015-07-05 ENCOUNTER — Other Ambulatory Visit: Payer: Self-pay | Admitting: Cardiovascular Disease

## 2015-07-05 DIAGNOSIS — Z7901 Long term (current) use of anticoagulants: Secondary | ICD-10-CM | POA: Diagnosis not present

## 2015-07-06 LAB — PROTIME-INR
INR: 2.3 — ABNORMAL HIGH (ref <1.50)
Prothrombin Time: 25.6 seconds — ABNORMAL HIGH (ref 11.6–15.2)

## 2015-07-09 ENCOUNTER — Ambulatory Visit (INDEPENDENT_AMBULATORY_CARE_PROVIDER_SITE_OTHER): Payer: Medicare Other | Admitting: Pharmacist Clinician (PhC)/ Clinical Pharmacy Specialist

## 2015-07-09 DIAGNOSIS — Z7901 Long term (current) use of anticoagulants: Secondary | ICD-10-CM

## 2015-08-06 ENCOUNTER — Other Ambulatory Visit: Payer: Self-pay | Admitting: Cardiovascular Disease

## 2015-08-06 DIAGNOSIS — Z7901 Long term (current) use of anticoagulants: Secondary | ICD-10-CM | POA: Diagnosis not present

## 2015-08-07 LAB — PROTIME-INR
INR: 2.81 — ABNORMAL HIGH (ref <1.50)
Prothrombin Time: 30 seconds — ABNORMAL HIGH (ref 11.6–15.2)

## 2015-08-16 ENCOUNTER — Ambulatory Visit (INDEPENDENT_AMBULATORY_CARE_PROVIDER_SITE_OTHER): Payer: Medicare Other | Admitting: Pharmacist Clinician (PhC)/ Clinical Pharmacy Specialist

## 2015-08-16 DIAGNOSIS — Z7901 Long term (current) use of anticoagulants: Secondary | ICD-10-CM

## 2015-09-05 ENCOUNTER — Other Ambulatory Visit: Payer: Self-pay | Admitting: Cardiovascular Disease

## 2015-09-05 NOTE — Telephone Encounter (Signed)
Rx request sent to pharmacy.  

## 2015-09-06 ENCOUNTER — Other Ambulatory Visit: Payer: Self-pay | Admitting: Cardiovascular Disease

## 2015-09-06 DIAGNOSIS — Z7901 Long term (current) use of anticoagulants: Secondary | ICD-10-CM | POA: Diagnosis not present

## 2015-09-07 LAB — PROTIME-INR
INR: 2.76 — AB (ref <1.50)
PROTHROMBIN TIME: 29.6 s — AB (ref 11.6–15.2)

## 2015-09-09 ENCOUNTER — Ambulatory Visit (INDEPENDENT_AMBULATORY_CARE_PROVIDER_SITE_OTHER): Payer: Medicare Other | Admitting: Pharmacist Clinician (PhC)/ Clinical Pharmacy Specialist

## 2015-09-09 DIAGNOSIS — Z7901 Long term (current) use of anticoagulants: Secondary | ICD-10-CM

## 2015-10-04 ENCOUNTER — Other Ambulatory Visit: Payer: Self-pay | Admitting: Cardiovascular Disease

## 2015-10-04 DIAGNOSIS — Z7901 Long term (current) use of anticoagulants: Secondary | ICD-10-CM | POA: Diagnosis not present

## 2015-10-04 LAB — PROTIME-INR
INR: 2.92 — ABNORMAL HIGH (ref <1.50)
PROTHROMBIN TIME: 30.9 s — AB (ref 11.6–15.2)

## 2015-10-07 ENCOUNTER — Ambulatory Visit (INDEPENDENT_AMBULATORY_CARE_PROVIDER_SITE_OTHER): Payer: Medicare Other | Admitting: Pharmacist Clinician (PhC)/ Clinical Pharmacy Specialist

## 2015-10-07 DIAGNOSIS — Z7901 Long term (current) use of anticoagulants: Secondary | ICD-10-CM

## 2015-11-04 ENCOUNTER — Other Ambulatory Visit: Payer: Self-pay | Admitting: Cardiovascular Disease

## 2015-11-04 DIAGNOSIS — Z7901 Long term (current) use of anticoagulants: Secondary | ICD-10-CM | POA: Diagnosis not present

## 2015-11-04 LAB — PROTIME-INR
INR: 2.35 — AB (ref <1.50)
Prothrombin Time: 26.1 seconds — ABNORMAL HIGH (ref 11.6–15.2)

## 2015-11-18 ENCOUNTER — Ambulatory Visit (INDEPENDENT_AMBULATORY_CARE_PROVIDER_SITE_OTHER): Payer: Medicare Other | Admitting: Pharmacist Clinician (PhC)/ Clinical Pharmacy Specialist

## 2015-11-18 DIAGNOSIS — Z7901 Long term (current) use of anticoagulants: Secondary | ICD-10-CM

## 2015-11-22 ENCOUNTER — Ambulatory Visit (INDEPENDENT_AMBULATORY_CARE_PROVIDER_SITE_OTHER): Payer: Medicare Other | Admitting: Physician Assistant

## 2015-11-22 ENCOUNTER — Encounter: Payer: Self-pay | Admitting: Physician Assistant

## 2015-11-22 VITALS — BP 124/60 | HR 58 | Ht 76.0 in | Wt 322.0 lb

## 2015-11-22 DIAGNOSIS — E785 Hyperlipidemia, unspecified: Secondary | ICD-10-CM

## 2015-11-22 DIAGNOSIS — Z720 Tobacco use: Secondary | ICD-10-CM | POA: Diagnosis not present

## 2015-11-22 DIAGNOSIS — I482 Chronic atrial fibrillation, unspecified: Secondary | ICD-10-CM

## 2015-11-22 MED ORDER — SIMVASTATIN 40 MG PO TABS
40.0000 mg | ORAL_TABLET | Freq: Every day | ORAL | Status: DC
Start: 2015-11-22 — End: 2016-12-04

## 2015-11-22 MED ORDER — WARFARIN SODIUM 4 MG PO TABS: 4.0000 mg | ORAL_TABLET | Freq: Every day | ORAL | Status: DC

## 2015-11-22 MED ORDER — DIGOXIN 125 MCG PO TABS: 125.0000 ug | ORAL_TABLET | Freq: Every day | ORAL | Status: DC

## 2015-11-22 MED ORDER — ATENOLOL 100 MG PO TABS: 200.0000 mg | ORAL_TABLET | Freq: Every day | ORAL | Status: DC

## 2015-11-22 NOTE — Progress Notes (Signed)
Cardiology Office Note:    Date:  11/22/2015   ID:  Cory Petty, DOB 29-Jan-1956, MRN [id number]  PCP:  Pcp Not In System  Cardiologist:  Dr. Shelva Majestic   Electrophysiologist:  n/a  Referring MD: No ref. provider found   Chief Complaint  Patient presents with  . Atrial Fibrillation    Follow-up    History of Present Illness:     Cory Petty is a 60 y.o. male with a hx of Chronic atrial fibrillation on Coumadin anticoagulation, prior CVA, tobacco abuse, HL, obesity, mild sleep apnea (not on CPAP therapy). Last seen by Dr. Claiborne Billings 4/16.  Returns for follow-up. Overall, doing well. Denies chest pain, shortness of breath, syncope. Denies orthopnea or PND. Denies significant pedal edema. Denies any bleeding issues.   Past Medical History  Diagnosis Date  . Hypersomnia with sleep apnea     Sleep Study (07/16/2010) AHI-2.3/hr, AHI during REM-9.4/hr, RDI-5.3/hr, REM during REM-12.8/hr, AVG O2 during REM and NREM was 93.0%  . Permanent atrial fibrillation ([place])   . CVA (cerebral vascular accident) ([place])     Remote, small  . Hyperlipidemia     Past Surgical History  Procedure Laterality Date  . Cerebral angiogram  07/04/2008    Small right anterior temporal arteriovenousmalformation unchanged in configuration or arterial feeders or venous drainage  . Cardiovascular stress test  06/18/2008    Perfusion defect in the inferior and apical myocardial region consistent with diaphragmatic attenuation. Remaining myocardium presents normal myocardial perfusion with no evidence of ischemia or infarct. EKG negative for ischemia.  . Transthoracic echocardiogram  08/01/2010    EF >55%, LA mild-moderately dilated,     Current Medications: Outpatient Prescriptions Prior to Visit  Medication Sig Dispense Refill  . acetaminophen (TYLENOL) 500 MG tablet Take 500 mg by mouth as needed for headache.     Marland Kitchen aspirin EC 81 MG tablet Take 81 mg by mouth daily.    Marland Kitchen gabapentin (NEURONTIN) 300 MG capsule Take  300 mg by mouth daily.    . potassium gluconate 595 MG TABS tablet Take 595 mg by mouth 2 (two) times daily.     Marland Kitchen atenolol (TENORMIN) 100 MG tablet TAKE 2 TABLETS BY MOUTH DAILY. 180 tablet 0  . DIGOX 125 MCG tablet TAKE 1 TABLET BY MOUTH ONCE DAILY. 90 tablet 0  . simvastatin (ZOCOR) 40 MG tablet TAKE 1 TABLET BY MOUTH ONCE DAILY. 90 tablet 0  . warfarin (COUMADIN) 4 MG tablet TAKE 1 TO 1 AND 1/2 TABLETS BY MOUTH DAILY OR AS DIRECTED. 120 tablet 1   No facility-administered medications prior to visit.     Allergies:   Review of patient's allergies indicates no known allergies.   Social History   Social History  . Marital Status: Widowed    Spouse Name: N/A  . Number of Children: N/A  . Years of Education: N/A   Social History Main Topics  . Smoking status: Current Every Day Smoker -- 1.00 packs/day for 29 years    Types: Cigarettes  . Smokeless tobacco: Never Used  . Alcohol Use: Yes     Comment: 2 beers per month  . Drug Use: None  . Sexual Activity: Not Asked   Other Topics Concern  . None   Social History Narrative     Family History:  The patient's family history includes Cancer in his brother, father, and mother.   ROS:   Please see the history of present illness.    ROS All  other systems reviewed and are negative.   Physical Exam:    VS:  BP 124/60 mmHg  Pulse 58  Ht 6\' 4"  (1.93 m)  Wt 322 lb (146.058 kg)  BMI 39.21 kg/m2   GEN: Well nourished, well developed, in no acute distress HEENT: normal Neck: no JVD, no masses Cardiac: Normal S1/S2, irregularly irregular rhythm; no murmurs, rubs, or gallops, no edema;     Respiratory:  clear to auscultation bilaterally; no wheezing, rhonchi or rales GI: soft, nontender, nondistended MS: no deformity or atrophy Skin: warm and dry Neuro: No focal deficits  Psych: Alert and oriented x 3, normal affect  Wt Readings from Last 3 Encounters:  11/22/15 322 lb (146.058 kg)  11/22/14 318 lb 14.4 oz (144.652 kg)    11/23/13 310 lb 1.6 oz (140.66 kg)      Studies/Labs Reviewed:     EKG:  EKG is   ordered today.  The ekg ordered today demonstrates Atrial fibrillation, HR 56, LAD, no change from prior tracing  Recent Labs: 12/04/2014: ALT 13; BUN 13; Creat 0.89; Hemoglobin 16.5; Platelets 196; Potassium 4.9; Sodium 140; TSH 1.240   Recent Lipid Panel    Component Value Date/Time   CHOL 122 12/04/2014 1140   TRIG 70 12/04/2014 1140   HDL 42 12/04/2014 1140   CHOLHDL 2.9 12/04/2014 1140   VLDL 14 12/04/2014 1140   LDLCALC 66 12/04/2014 1140    Additional studies/ records that were reviewed today include:    Echo 12/11 EF >55%, borderline LVH, mild to moderate LAE, mild RAE, mild MR, aortic sclerosis, no aortic stenosis  Myoview 11/09 Diaphragmatic attenuation, no ischemia or infarct, non-gated secondary to atrial fibrillation; Low Risk   ASSESSMENT:     1. Chronic atrial fibrillation ([place])   2. Hyperlipidemia LDL goal <70   3. Tobacco abuse     PLAN:     In order of problems listed above:  1. Chronic atrial fibrillation - Rate controlled. He is tolerating his Coumadin. Continue current regimen.CHADS2-VASc=2 (prior CVA).  Arrange routine FU CBC, Dig level, CMET.  2. HL - LDL in 5/16 was 66.  Arrange FU CMET, Lipids. Continue statin.    3. Tobacco abuse - We discussed strategies for cessation.    Medication Adjustments/Labs and Tests Ordered: Current medicines are reviewed at length with the patient today.  Concerns regarding medicines are outlined above.  Medication changes, Labs and Tests ordered today are outlined in the Patient Instructions noted below. Patient Instructions  Medication Instructions:  Your physician recommends that you continue on your current medications as directed. Please refer to the Current Medication list given to you today. Labwork: YOU HAVE BEEN GIVEN AN RX FOR LAB TO BE DONE IN [place], CBC W/DIFF, CMET, LIPID(FASTING ) AND DIGOXIN  LEVEL Testing/Procedures: NONE Follow-Up: Your physician wants you to follow-up in: [place] DR. Claiborne Billings You will receive a reminder letter in the mail two months in advance. If you don't receive a letter, please call our office to schedule the follow-up appointment. Any Other Special Instructions Will Be Listed Below (If Applicable). If you need a refill on your cardiac medications before your next appointment, please call your pharmacy.     Signed, Richardson Dopp, PA-C  11/22/2015 1:18 PM    [facility] [place], [place], [place]  [address] Phone: [phone]; Fax: [phone]

## 2015-11-22 NOTE — Patient Instructions (Addendum)
Medication Instructions:  Your physician recommends that you continue on your current medications as directed. Please refer to the Current Medication list given to you today. Labwork: YOU HAVE BEEN GIVEN AN RX FOR LAB TO BE DONE IN [place], CBC W/DIFF, CMET, LIPID(FASTING ) AND DIGOXIN LEVEL Testing/Procedures: NONE Follow-Up: Your physician wants you to follow-up in: [place] DR. Claiborne Billings You will receive a reminder letter in the mail two months in advance. If you don't receive a letter, please call our office to schedule the follow-up appointment. Any Other Special Instructions Will Be Listed Below (If Applicable). If you need a refill on your cardiac medications before your next appointment, please call your pharmacy.

## 2015-12-03 DIAGNOSIS — I482 Chronic atrial fibrillation: Secondary | ICD-10-CM | POA: Diagnosis not present

## 2015-12-03 DIAGNOSIS — E782 Mixed hyperlipidemia: Secondary | ICD-10-CM | POA: Diagnosis not present

## 2015-12-03 DIAGNOSIS — G629 Polyneuropathy, unspecified: Secondary | ICD-10-CM | POA: Diagnosis not present

## 2015-12-03 DIAGNOSIS — I10 Essential (primary) hypertension: Secondary | ICD-10-CM | POA: Diagnosis not present

## 2015-12-04 ENCOUNTER — Other Ambulatory Visit: Payer: Self-pay | Admitting: Physician Assistant

## 2015-12-04 ENCOUNTER — Other Ambulatory Visit: Payer: Self-pay | Admitting: Cardiovascular Disease

## 2015-12-04 DIAGNOSIS — Z7901 Long term (current) use of anticoagulants: Secondary | ICD-10-CM | POA: Diagnosis not present

## 2015-12-04 DIAGNOSIS — E785 Hyperlipidemia, unspecified: Secondary | ICD-10-CM | POA: Diagnosis not present

## 2015-12-04 DIAGNOSIS — I482 Chronic atrial fibrillation: Secondary | ICD-10-CM | POA: Diagnosis not present

## 2015-12-04 LAB — COMPREHENSIVE METABOLIC PANEL
ALK PHOS: 81 U/L (ref 40–115)
ALT: 13 U/L (ref 9–46)
AST: 15 U/L (ref 10–35)
Albumin: 4.1 g/dL (ref 3.6–5.1)
BUN: 16 mg/dL (ref 7–25)
CALCIUM: 9.3 mg/dL (ref 8.6–10.3)
CHLORIDE: 104 mmol/L (ref 98–110)
CO2: 29 mmol/L (ref 20–31)
Creat: 0.9 mg/dL (ref 0.70–1.25)
GLUCOSE: 89 mg/dL (ref 65–99)
POTASSIUM: 4.9 mmol/L (ref 3.5–5.3)
Sodium: 142 mmol/L (ref 135–146)
Total Bilirubin: 0.6 mg/dL (ref 0.2–1.2)
Total Protein: 6.9 g/dL (ref 6.1–8.1)

## 2015-12-04 LAB — CBC WITH DIFFERENTIAL/PLATELET
BASOS ABS: 68 {cells}/uL (ref 0–200)
BASOS PCT: 1 %
EOS ABS: 204 {cells}/uL (ref 15–500)
Eosinophils Relative: 3 %
HEMATOCRIT: 48.3 % (ref 38.5–50.0)
Hemoglobin: 16 g/dL (ref 13.2–17.1)
LYMPHS PCT: 30 %
Lymphs Abs: 2040 cells/uL (ref 850–3900)
MCH: 32.1 pg (ref 27.0–33.0)
MCHC: 33.1 g/dL (ref 32.0–36.0)
MCV: 97 fL (ref 80.0–100.0)
MONO ABS: 612 {cells}/uL (ref 200–950)
MONOS PCT: 9 %
MPV: 10.7 fL (ref 7.5–12.5)
NEUTROS PCT: 57 %
Neutro Abs: 3876 cells/uL (ref 1500–7800)
Platelets: 209 10*3/uL (ref 140–400)
RBC: 4.98 MIL/uL (ref 4.20–5.80)
RDW: 13.5 % (ref 11.0–15.0)
WBC: 6.8 10*3/uL (ref 3.8–10.8)

## 2015-12-04 LAB — PROTIME-INR
INR: 1.95 — AB (ref <1.50)
Prothrombin Time: 22.5 seconds — ABNORMAL HIGH (ref 11.6–15.2)

## 2015-12-04 LAB — LIPID PANEL
CHOL/HDL RATIO: 2.8 ratio (ref <=5.0)
CHOLESTEROL: 125 mg/dL (ref 125–200)
HDL: 45 mg/dL (ref >=40)
LDL Cholesterol: 70 mg/dL (ref <130)
Triglycerides: 50 mg/dL (ref <150)
VLDL: 10 mg/dL (ref <30)

## 2015-12-05 LAB — DIGOXIN LEVEL

## 2015-12-06 ENCOUNTER — Telehealth: Payer: Self-pay | Admitting: *Deleted

## 2015-12-06 NOTE — Telephone Encounter (Signed)
Lmtcb to go over lab results 

## 2015-12-17 ENCOUNTER — Telehealth: Payer: Self-pay | Admitting: Cardiovascular Disease

## 2015-12-17 ENCOUNTER — Telehealth: Payer: Self-pay | Admitting: Pharmacist

## 2015-12-17 NOTE — Telephone Encounter (Signed)
LM to have patient go to lab to have INR checked as is overdue.

## 2015-12-17 NOTE — Telephone Encounter (Signed)
New message      The pt said they missed a phone call pt is uncertain of what the phone call is about.

## 2015-12-18 ENCOUNTER — Ambulatory Visit (INDEPENDENT_AMBULATORY_CARE_PROVIDER_SITE_OTHER): Payer: Medicare Other | Admitting: Pharmacist Clinician (PhC)/ Clinical Pharmacy Specialist

## 2015-12-18 DIAGNOSIS — Z7901 Long term (current) use of anticoagulants: Secondary | ICD-10-CM

## 2015-12-18 DIAGNOSIS — I482 Chronic atrial fibrillation, unspecified: Secondary | ICD-10-CM

## 2015-12-18 NOTE — Progress Notes (Signed)
Reviewed all labs with patient.

## 2016-01-03 ENCOUNTER — Other Ambulatory Visit: Payer: Self-pay | Admitting: Cardiovascular Disease

## 2016-01-03 DIAGNOSIS — Z7901 Long term (current) use of anticoagulants: Secondary | ICD-10-CM | POA: Diagnosis not present

## 2016-01-03 LAB — PROTIME-INR
INR: 2.34 — AB (ref <1.50)
PROTHROMBIN TIME: 26 s — AB (ref 11.6–15.2)

## 2016-01-08 ENCOUNTER — Ambulatory Visit (INDEPENDENT_AMBULATORY_CARE_PROVIDER_SITE_OTHER): Payer: Medicare Other | Admitting: Pharmacist Clinician (PhC)/ Clinical Pharmacy Specialist

## 2016-01-08 DIAGNOSIS — I482 Chronic atrial fibrillation, unspecified: Secondary | ICD-10-CM

## 2016-01-08 DIAGNOSIS — Z7901 Long term (current) use of anticoagulants: Secondary | ICD-10-CM

## 2016-01-15 DIAGNOSIS — I482 Chronic atrial fibrillation: Secondary | ICD-10-CM | POA: Diagnosis not present

## 2016-01-15 DIAGNOSIS — G589 Mononeuropathy, unspecified: Secondary | ICD-10-CM | POA: Diagnosis not present

## 2016-01-15 DIAGNOSIS — E782 Mixed hyperlipidemia: Secondary | ICD-10-CM | POA: Diagnosis not present

## 2016-01-15 DIAGNOSIS — I10 Essential (primary) hypertension: Secondary | ICD-10-CM | POA: Diagnosis not present

## 2016-01-30 NOTE — Telephone Encounter (Signed)
No encounter note

## 2016-02-03 ENCOUNTER — Other Ambulatory Visit: Payer: Self-pay | Admitting: Cardiovascular Disease

## 2016-02-03 DIAGNOSIS — Z7901 Long term (current) use of anticoagulants: Secondary | ICD-10-CM | POA: Diagnosis not present

## 2016-02-03 LAB — PROTIME-INR
INR: 3.5 — AB
Prothrombin Time: 35.4 s — ABNORMAL HIGH (ref 9.0–11.5)

## 2016-02-05 ENCOUNTER — Ambulatory Visit (INDEPENDENT_AMBULATORY_CARE_PROVIDER_SITE_OTHER): Payer: Medicare Other | Admitting: Pharmacist Clinician (PhC)/ Clinical Pharmacy Specialist

## 2016-02-05 DIAGNOSIS — I482 Chronic atrial fibrillation, unspecified: Secondary | ICD-10-CM

## 2016-02-05 DIAGNOSIS — Z7901 Long term (current) use of anticoagulants: Secondary | ICD-10-CM

## 2016-03-05 ENCOUNTER — Other Ambulatory Visit: Payer: Self-pay | Admitting: Cardiovascular Disease

## 2016-03-05 DIAGNOSIS — Z7901 Long term (current) use of anticoagulants: Secondary | ICD-10-CM | POA: Diagnosis not present

## 2016-03-05 LAB — PROTIME-INR
INR: 2.8 — ABNORMAL HIGH
Prothrombin Time: 28.5 s — ABNORMAL HIGH (ref 9.0–11.5)

## 2016-03-09 ENCOUNTER — Ambulatory Visit (INDEPENDENT_AMBULATORY_CARE_PROVIDER_SITE_OTHER): Payer: Medicare Other | Admitting: Pharmacist Clinician (PhC)/ Clinical Pharmacy Specialist

## 2016-03-09 DIAGNOSIS — I482 Chronic atrial fibrillation, unspecified: Secondary | ICD-10-CM

## 2016-03-09 DIAGNOSIS — Z7901 Long term (current) use of anticoagulants: Secondary | ICD-10-CM

## 2016-04-03 ENCOUNTER — Other Ambulatory Visit: Payer: Self-pay | Admitting: Cardiovascular Disease

## 2016-04-03 DIAGNOSIS — Z7901 Long term (current) use of anticoagulants: Secondary | ICD-10-CM | POA: Diagnosis not present

## 2016-04-03 LAB — PROTIME-INR
INR: 2.4 — ABNORMAL HIGH
Prothrombin Time: 24.6 s — ABNORMAL HIGH (ref 9.0–11.5)

## 2016-04-07 ENCOUNTER — Ambulatory Visit (INDEPENDENT_AMBULATORY_CARE_PROVIDER_SITE_OTHER): Payer: Medicare Other | Admitting: Pharmacist Clinician (PhC)/ Clinical Pharmacy Specialist

## 2016-04-07 DIAGNOSIS — Z7901 Long term (current) use of anticoagulants: Secondary | ICD-10-CM

## 2016-04-07 DIAGNOSIS — I482 Chronic atrial fibrillation, unspecified: Secondary | ICD-10-CM

## 2016-05-05 ENCOUNTER — Other Ambulatory Visit: Payer: Self-pay | Admitting: Cardiovascular Disease

## 2016-05-05 DIAGNOSIS — Z7901 Long term (current) use of anticoagulants: Secondary | ICD-10-CM | POA: Diagnosis not present

## 2016-05-05 DIAGNOSIS — Z23 Encounter for immunization: Secondary | ICD-10-CM | POA: Diagnosis not present

## 2016-05-06 LAB — PROTIME-INR
INR: 3 — ABNORMAL HIGH
Prothrombin Time: 30.4 s — ABNORMAL HIGH (ref 9.0–11.5)

## 2016-05-11 ENCOUNTER — Ambulatory Visit (INDEPENDENT_AMBULATORY_CARE_PROVIDER_SITE_OTHER): Payer: Medicare Other | Admitting: Pharmacist Clinician (PhC)/ Clinical Pharmacy Specialist

## 2016-05-11 DIAGNOSIS — Z7901 Long term (current) use of anticoagulants: Secondary | ICD-10-CM

## 2016-05-11 DIAGNOSIS — I482 Chronic atrial fibrillation, unspecified: Secondary | ICD-10-CM

## 2016-06-05 ENCOUNTER — Other Ambulatory Visit: Payer: Self-pay | Admitting: Cardiovascular Disease

## 2016-06-05 DIAGNOSIS — Z7901 Long term (current) use of anticoagulants: Secondary | ICD-10-CM | POA: Diagnosis not present

## 2016-06-05 LAB — PROTIME-INR
INR: 2.3 — ABNORMAL HIGH
PROTHROMBIN TIME: 23.9 s — AB (ref 9.0–11.5)

## 2016-06-08 ENCOUNTER — Ambulatory Visit (INDEPENDENT_AMBULATORY_CARE_PROVIDER_SITE_OTHER): Payer: Medicare Other | Admitting: Pharmacist Clinician (PhC)/ Clinical Pharmacy Specialist

## 2016-06-08 DIAGNOSIS — I482 Chronic atrial fibrillation, unspecified: Secondary | ICD-10-CM

## 2016-06-08 DIAGNOSIS — Z7901 Long term (current) use of anticoagulants: Secondary | ICD-10-CM

## 2016-07-03 ENCOUNTER — Other Ambulatory Visit: Payer: Self-pay | Admitting: Cardiovascular Disease

## 2016-07-03 DIAGNOSIS — Z7901 Long term (current) use of anticoagulants: Secondary | ICD-10-CM | POA: Diagnosis not present

## 2016-07-04 LAB — PROTIME-INR
INR: 2.5 — AB
Prothrombin Time: 25.8 s — ABNORMAL HIGH (ref 9.0–11.5)

## 2016-07-14 ENCOUNTER — Ambulatory Visit (INDEPENDENT_AMBULATORY_CARE_PROVIDER_SITE_OTHER): Payer: Medicare Other | Admitting: Pharmacist Clinician (PhC)/ Clinical Pharmacy Specialist

## 2016-07-14 DIAGNOSIS — I482 Chronic atrial fibrillation, unspecified: Secondary | ICD-10-CM

## 2016-07-14 DIAGNOSIS — Z7901 Long term (current) use of anticoagulants: Secondary | ICD-10-CM

## 2016-08-05 ENCOUNTER — Other Ambulatory Visit: Payer: Self-pay | Admitting: Cardiovascular Disease

## 2016-08-05 DIAGNOSIS — Z7901 Long term (current) use of anticoagulants: Secondary | ICD-10-CM | POA: Diagnosis not present

## 2016-08-05 LAB — PROTIME-INR
INR: 1.6 — AB
PROTHROMBIN TIME: 17.1 s — AB (ref 9.0–11.5)

## 2016-08-06 ENCOUNTER — Ambulatory Visit (INDEPENDENT_AMBULATORY_CARE_PROVIDER_SITE_OTHER): Payer: Medicare Other | Admitting: Pharmacist Clinician (PhC)/ Clinical Pharmacy Specialist

## 2016-08-06 DIAGNOSIS — Z7901 Long term (current) use of anticoagulants: Secondary | ICD-10-CM

## 2016-08-06 DIAGNOSIS — I482 Chronic atrial fibrillation, unspecified: Secondary | ICD-10-CM

## 2016-09-07 ENCOUNTER — Other Ambulatory Visit: Payer: Self-pay | Admitting: Cardiovascular Disease

## 2016-09-07 DIAGNOSIS — Z7901 Long term (current) use of anticoagulants: Secondary | ICD-10-CM | POA: Diagnosis not present

## 2016-09-07 LAB — PROTIME-INR
INR: 3.4 — ABNORMAL HIGH
Prothrombin Time: 34 s — ABNORMAL HIGH (ref 9.0–11.5)

## 2016-09-08 ENCOUNTER — Ambulatory Visit (INDEPENDENT_AMBULATORY_CARE_PROVIDER_SITE_OTHER): Payer: Medicare Other | Admitting: Pharmacist

## 2016-09-08 DIAGNOSIS — I482 Chronic atrial fibrillation, unspecified: Secondary | ICD-10-CM

## 2016-09-08 DIAGNOSIS — I639 Cerebral infarction, unspecified: Secondary | ICD-10-CM

## 2016-09-08 DIAGNOSIS — Z7901 Long term (current) use of anticoagulants: Secondary | ICD-10-CM

## 2016-10-02 ENCOUNTER — Other Ambulatory Visit: Payer: Self-pay | Admitting: Cardiovascular Disease

## 2016-10-02 DIAGNOSIS — Z7901 Long term (current) use of anticoagulants: Secondary | ICD-10-CM | POA: Diagnosis not present

## 2016-10-03 LAB — PROTIME-INR
INR: 2.7 — ABNORMAL HIGH
Prothrombin Time: 27.6 s — ABNORMAL HIGH (ref 9.0–11.5)

## 2016-10-05 ENCOUNTER — Ambulatory Visit (INDEPENDENT_AMBULATORY_CARE_PROVIDER_SITE_OTHER): Payer: Medicare Other | Admitting: Pharmacist Clinician (PhC)/ Clinical Pharmacy Specialist

## 2016-10-05 DIAGNOSIS — Z7901 Long term (current) use of anticoagulants: Secondary | ICD-10-CM

## 2016-10-05 DIAGNOSIS — I482 Chronic atrial fibrillation, unspecified: Secondary | ICD-10-CM

## 2016-10-05 DIAGNOSIS — I639 Cerebral infarction, unspecified: Secondary | ICD-10-CM

## 2016-11-03 ENCOUNTER — Other Ambulatory Visit: Payer: Self-pay | Admitting: Cardiovascular Disease

## 2016-11-03 DIAGNOSIS — Z7901 Long term (current) use of anticoagulants: Secondary | ICD-10-CM | POA: Diagnosis not present

## 2016-11-04 ENCOUNTER — Ambulatory Visit (INDEPENDENT_AMBULATORY_CARE_PROVIDER_SITE_OTHER): Payer: Medicare Other | Admitting: Pharmacist Clinician (PhC)/ Clinical Pharmacy Specialist

## 2016-11-04 DIAGNOSIS — I639 Cerebral infarction, unspecified: Secondary | ICD-10-CM | POA: Diagnosis not present

## 2016-11-04 DIAGNOSIS — Z7901 Long term (current) use of anticoagulants: Secondary | ICD-10-CM

## 2016-11-04 DIAGNOSIS — I482 Chronic atrial fibrillation, unspecified: Secondary | ICD-10-CM

## 2016-11-04 LAB — PROTIME-INR
INR: 2.2 — ABNORMAL HIGH
PROTHROMBIN TIME: 22.5 s — AB (ref 9.0–11.5)

## 2016-11-25 ENCOUNTER — Ambulatory Visit (INDEPENDENT_AMBULATORY_CARE_PROVIDER_SITE_OTHER): Payer: Medicare Other | Admitting: Cardiovascular Disease

## 2016-11-25 ENCOUNTER — Encounter: Payer: Self-pay | Admitting: Cardiovascular Disease

## 2016-11-25 VITALS — BP 118/68 | HR 67 | Ht 76.0 in | Wt 338.4 lb

## 2016-11-25 DIAGNOSIS — E785 Hyperlipidemia, unspecified: Secondary | ICD-10-CM | POA: Diagnosis not present

## 2016-11-25 DIAGNOSIS — Z79899 Other long term (current) drug therapy: Secondary | ICD-10-CM | POA: Diagnosis not present

## 2016-11-25 DIAGNOSIS — I639 Cerebral infarction, unspecified: Secondary | ICD-10-CM | POA: Diagnosis not present

## 2016-11-25 DIAGNOSIS — I482 Chronic atrial fibrillation, unspecified: Secondary | ICD-10-CM

## 2016-11-25 DIAGNOSIS — Z7901 Long term (current) use of anticoagulants: Secondary | ICD-10-CM | POA: Diagnosis not present

## 2016-11-25 DIAGNOSIS — Z716 Tobacco abuse counseling: Secondary | ICD-10-CM | POA: Diagnosis not present

## 2016-11-25 NOTE — Progress Notes (Signed)
Patient ID: Cory Petty, male   DOB: 1956/07/11, 61 y.o.   MRN: [id number]     HPI: Cory Petty is a 61 y.o. male who presents for 2 year cardiology evaluation  Cory Petty has a history of permanent atrial fibrillation and has been on chronic Coumadin therapy.  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  He has a long-standing history of tobacco use and currently is still smoking 1 pack per day. He has a history of hyperlipidemia, and moderate obesity.  A sleep study in December 2011 revealed mild increased upper airways resistance syndrome with mild sleep apnea, only during REM sleep and he is not on CPAP therapy.  He has been on disability since his stroke and after his wife's death.  He is a former Administrator.  Over the past year, [name] denies any change in symptoms.   He walks a aproximately  3000 steps  For 3-4 days per week.  He is unaware of any chest pressure.  He denies shortness of breath.  He is unaware of palpitations.  He is regular in getting his Coumadin checked.    Since I last saw him 2 years ago, he has remained relatively stable.  He continues to have his INR checked in on 11/03/2016.  This was therapeutic at 2.2.  He underwent lipid studies in May 2017 and on simvastatin 40 mg LDL was 70, total cholesterol 125, HDL 45, triglycerides 50.  He continues to be on atenolol 200 mg daily, digoxin 0.125 mg for rate control of his permanent atrial fibrillation.  His peripheral neuropathy.  Is treated with gabapentin.  In the past, we discussed dc aspirin with his warfarin but he prefers to stay on this and denies any bleeding issues.  He has not been successful with weight loss.  He has reduced his smoking from 10 packs per week to 4 packs per week.  He presents for evaluation  Past Medical History:  Diagnosis Date  . CVA (cerebral vascular accident) ([place])    Remote, small  . Hyperlipidemia   . Hypersomnia with sleep apnea    Sleep Study  (07/16/2010) AHI-2.3/hr, AHI during REM-9.4/hr, RDI-5.3/hr, REM during REM-12.8/hr, AVG O2 during REM and NREM was 93.0%  . Permanent atrial fibrillation [facility])     Past Surgical History:  Procedure Laterality Date  . CARDIOVASCULAR STRESS TEST  06/18/2008   Perfusion defect in the inferior and apical myocardial region consistent with diaphragmatic attenuation. Remaining myocardium presents normal myocardial perfusion with no evidence of ischemia or infarct. EKG negative for ischemia.  . 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 in configuration or arterial feeders or venous drainage  . TRANSTHORACIC ECHOCARDIOGRAM  08/01/2010   EF >55%, LA mild-moderately dilated,     No Known Allergies  Current Outpatient Prescriptions  Medication Sig Dispense Refill  . acetaminophen (TYLENOL) 500 MG tablet Take 500 mg by mouth as needed for headache.     Marland Kitchen aspirin EC 81 MG tablet Take 81 mg by mouth daily.    Marland Kitchen atenolol (TENORMIN) 100 MG tablet Take 2 tablets (200 mg total) by mouth daily. 180 tablet 3  . digoxin (DIGOX) 0.125 MG tablet Take 1 tablet (125 mcg total) by mouth daily. 90 tablet 3  . gabapentin (NEURONTIN) 300 MG capsule Take 300 mg by mouth daily.    . potassium gluconate 595 MG TABS tablet Take 595  mg by mouth 2 (two) times daily.     . simvastatin (ZOCOR) 40 MG tablet Take 1 tablet (40 mg total) by mouth daily. 90 tablet 3  . warfarin (COUMADIN) 4 MG tablet Take 1 tablet (4 mg total) by mouth daily at 6 PM. 90 tablet 3   No current facility-administered medications for this visit.     Social History   Social History  . Marital status: Widowed    Spouse name: N/A  . Number of children: N/A  . Years of education: N/A   Occupational History  . Not on file.   Social History Main Topics  . Smoking status: Current Every Day Smoker    Packs/day: 1.00    Years: 29.00    Types: Cigarettes  . Smokeless tobacco: Never Used    . Alcohol use Yes     Comment: 2 beers per month  . Drug use: Unknown  . Sexual activity: Not on file   Other Topics Concern  . Not on file   Social History Narrative  . No narrative on file   Socially, he is widowed.  He has no children.  Does smoke tobacco one pack per day.  He is a former Administrator.  Family History  Problem Relation Age of Onset  . Cancer Mother     Lung cancer  . Cancer Father     Stomach cancer  . Cancer Brother     ROS General: Negative; No fevers, chills, or night sweats;  Positive for obesity HEENT: Negative; No changes in vision or hearing, sinus congestion, difficulty swallowing Pulmonary: Negative; No cough, wheezing, shortness of breath, hemoptysis Cardiovascular: Negative; No chest pain, presyncope, syncope, palpitations GI: Negative; No nausea, vomiting, diarrhea, or abdominal pain GU: Negative; No dysuria, hematuria, or difficulty voiding Musculoskeletal: Negative; no myalgias, joint pain, or weakness Hematologic/Oncology: Negative; no easy bruising, bleeding Endocrine: Negative; no heat/cold intolerance; no diabetes Neuro: Negative; no changes in balance, headaches Skin: Negative; No rashes or skin lesions Psychiatric: Negative; No behavioral problems, depression Sleep: Negative; No snoring, daytime sleepiness, hypersomnolence, bruxism, restless legs, hypnogognic hallucinations, no cataplexy Other comprehensive 14 point system review is negative.  PE BP 118/68   Pulse 67   Ht 6' 4"  (1.93 m)   Wt (!) 338 lb 6.4 oz (153.5 kg)   BMI 41.19 kg/m    Wt Readings from Last 3 Encounters:  11/25/16 (!) 338 lb 6.4 oz (153.5 kg)  11/22/15 (!) 322 lb (146.1 kg)  11/22/14 (!) 318 lb 14.4 oz (144.7 kg)   General: Alert, oriented, no distress.  Skin: normal turgor, no rashes HEENT: Normocephalic, atraumatic. Pupils round and reactive; sclera anicteric;no lid lag. Extraocular muscles intact;; no xanthelasmas. Nose without nasal septal  hypertrophy Mouth/Parynx benign; Mallinpatti scale 3 Neck: No JVD, no carotid bruits; normal carotid upstroke Lungs: clear to ausculatation and percussion; no wheezing or rales Chest wall: no tenderness to palpitation Heart: Irregular irregular rhythm compatible with residual fibrillation with a rate in the 60s to 70s., s1 s2 normal; 1/6 systolic murmur.  no diastolic murmur, rub thrills or heaves Abdomen:  Moderate central adiposity; soft, nontender; no hepatosplenomehaly, BS+; abdominal aorta nontender and not dilated by palpation. Back: no CVA tenderness Pulses 2+ Extremities:  Trivial ankle edema ;no clubbing or cyanosis, Homan's sign negative  Neurologic: grossly nonfocal; cranial nerves grossly normal. Psychologic: normal affect and mood.  ECG (independently read by me): Atrial fibrillation with a ventricular rate at 67.  Nonspecific interventricular conduction delay, late.  April 2016 ECG (independently read by me):  Atrial fibrillation with a controlled ventricular response at 59 bpm.  Normal intervals.   April 2015 ECG (independently read by me): Atrial fibrillation at 67 beats per minute.  Possible LVH by voltage criteria in aVL.  No significant ST-T change.  LABS:  BMP Latest Ref Rng & Units 12/04/2015 12/04/2014 12/08/2013  Glucose 65 - 99 mg/dL 89 80 100(H)  BUN 7 - 25 mg/dL 16 13 14   Creatinine 0.70 - 1.25 mg/dL 0.90 0.89 1.03  Sodium 135 - 146 mmol/L 142 140 140  Potassium 3.5 - 5.3 mmol/L 4.9 4.9 5.0  Chloride 98 - 110 mmol/L 104 105 104  CO2 20 - 31 mmol/L 29 26 29   Calcium 8.6 - 10.3 mg/dL 9.3 8.8 9.6    Hepatic Function Latest Ref Rng & Units 12/04/2015 12/04/2014 12/08/2013  Total Protein 6.1 - 8.1 g/dL 6.9 6.8 6.9  Albumin 3.6 - 5.1 g/dL 4.1 3.9 4.1  AST 10 - 35 U/L 15 16 16   ALT 9 - 46 U/L 13 13 14   Alk Phosphatase 40 - 115 U/L 81 75 90  Total Bilirubin 0.2 - 1.2 mg/dL 0.6 0.7 0.6    CBC Latest Ref Rng & Units 12/04/2015 12/04/2014 12/08/2013  WBC 3.8 - 10.8 K/uL 6.8 7.0  5.5  Hemoglobin 13.2 - 17.1 g/dL 16.0 16.5 16.5  Hematocrit 38.5 - 50.0 % 48.3 48.7 48.6  Platelets 140 - 400 K/uL 209 196 226   Lab Results  Component Value Date   TSH 1.240 12/04/2014    BNP No results found for: PROBNP  Lipid Panel     Component Value Date/Time   CHOL 125 12/04/2015 1034   TRIG 50 12/04/2015 1034   HDL 45 12/04/2015 1034   CHOLHDL 2.8 12/04/2015 1034   VLDL 10 12/04/2015 1034   LDLCALC 70 12/04/2015 1034     RADIOLOGY: No results found.  IMPRESSION:  1. Chronic atrial fibrillation ([place])   2. Hyperlipidemia LDL goal <70   3. Medication management   4. Long term current use of anticoagulant therapy   5. Morbid obesity ([place])   6. Tobacco abuse counseling     ASSESSMENT AND PLAN: Mr. Ashden Sonnenberg  Is a 61 year old gentleman with a long-standing history of permanent atrial fibrillation for which he is on  chronic Coumadin therapy,  He suffered a small CVA in June 2009,  Since that time, he is retired and has been on disability.  His atrial fibrillation rate is well-controlled in the 60s.  He is on Coumadin and most recent INR is therapeutic.  His blood pressure today is stable.  We discussed the importance of complete smoking cessation and he has reduced his smoking from 10 packs per week down to 4 packs per week.  He has not been successful with weight loss.  He is now morbidly obese with a BMI of 41.2.  Exercise and weight loss was recommended.  He denies any issues with sleep disturbance.  A complete set of fasting laboratory will be obtained.  Since he does not have underlying CA I again discussed continuing warfarin alone but he prefers to stay on 81 mg of aspirin as well.  He will continue on his current dose of simvastatin.  I will see him in one year for reevaluation.  Adjustments to his medications will be made depending on blood work if necessary.  Time spent: 25 minutes  Troy Sine, MD, [facility]  11/27/2016 12:13 PM

## 2016-11-25 NOTE — Patient Instructions (Signed)
Your physician recommends that you return for lab work FASTING.  Your physician wants you to follow-up in: 1 year or sooner if needed. You will receive a reminder letter in the mail two months in advance. If you don't receive a letter, please call our office to schedule the follow-up appointment.  If you need a refill on your cardiac medications before your next appointment, please call your pharmacy.

## 2016-11-30 ENCOUNTER — Other Ambulatory Visit: Payer: Self-pay | Admitting: Physician Assistant

## 2016-11-30 DIAGNOSIS — I482 Chronic atrial fibrillation, unspecified: Secondary | ICD-10-CM

## 2016-12-01 ENCOUNTER — Other Ambulatory Visit: Payer: Self-pay | Admitting: *Deleted

## 2016-12-01 DIAGNOSIS — I482 Chronic atrial fibrillation, unspecified: Secondary | ICD-10-CM

## 2016-12-01 MED ORDER — WARFARIN SODIUM 4 MG PO TABS: ORAL_TABLET | ORAL | 1 refills | Status: DC

## 2016-12-01 MED ORDER — DIGOXIN 125 MCG PO TABS: 125.0000 ug | ORAL_TABLET | Freq: Every day | ORAL | 3 refills | Status: DC

## 2016-12-01 NOTE — Telephone Encounter (Signed)
REFILL 

## 2016-12-01 NOTE — Addendum Note (Signed)
Addended by: Rockne Menghini on: 12/01/2016 05:29 PM   Modules accepted: Orders

## 2016-12-03 ENCOUNTER — Other Ambulatory Visit: Payer: Self-pay | Admitting: Cardiovascular Disease

## 2016-12-03 DIAGNOSIS — E785 Hyperlipidemia, unspecified: Secondary | ICD-10-CM | POA: Diagnosis not present

## 2016-12-03 DIAGNOSIS — I482 Chronic atrial fibrillation: Secondary | ICD-10-CM | POA: Diagnosis not present

## 2016-12-03 DIAGNOSIS — Z79899 Other long term (current) drug therapy: Secondary | ICD-10-CM | POA: Diagnosis not present

## 2016-12-03 DIAGNOSIS — Z7901 Long term (current) use of anticoagulants: Secondary | ICD-10-CM | POA: Diagnosis not present

## 2016-12-03 LAB — COMPREHENSIVE METABOLIC PANEL
ALK PHOS: 71 U/L (ref 40–115)
ALT: 14 U/L (ref 9–46)
AST: 17 U/L (ref 10–35)
Albumin: 3.8 g/dL (ref 3.6–5.1)
BILIRUBIN TOTAL: 0.6 mg/dL (ref 0.2–1.2)
BUN: 13 mg/dL (ref 7–25)
CO2: 28 mmol/L (ref 20–31)
CREATININE: 0.86 mg/dL (ref 0.70–1.25)
Calcium: 8.8 mg/dL (ref 8.6–10.3)
Chloride: 106 mmol/L (ref 98–110)
Glucose, Bld: 97 mg/dL (ref 65–99)
Potassium: 4.6 mmol/L (ref 3.5–5.3)
SODIUM: 141 mmol/L (ref 135–146)
Total Protein: 6.8 g/dL (ref 6.1–8.1)

## 2016-12-03 LAB — CBC
HEMATOCRIT: 43.1 % (ref 38.5–50.0)
HEMOGLOBIN: 14.3 g/dL (ref 13.2–17.1)
MCH: 32.1 pg (ref 27.0–33.0)
MCHC: 33.2 g/dL (ref 32.0–36.0)
MCV: 96.9 fL (ref 80.0–100.0)
MPV: 10.8 fL (ref 7.5–12.5)
Platelets: 202 10*3/uL (ref 140–400)
RBC: 4.45 MIL/uL (ref 4.20–5.80)
RDW: 13.8 % (ref 11.0–15.0)
WBC: 6.2 10*3/uL (ref 3.8–10.8)

## 2016-12-03 LAB — LIPID PANEL
Cholesterol: 115 mg/dL (ref <200)
HDL: 42 mg/dL (ref >40)
LDL Cholesterol: 59 mg/dL (ref <100)
Total CHOL/HDL Ratio: 2.7 Ratio (ref <5.0)
Triglycerides: 69 mg/dL (ref <150)
VLDL: 14 mg/dL (ref <30)

## 2016-12-03 LAB — PROTIME-INR
INR: 3 — AB
Prothrombin Time: 30.4 s — ABNORMAL HIGH (ref 9.0–11.5)

## 2016-12-03 LAB — TSH: TSH: 3.1 mIU/L (ref 0.40–4.50)

## 2016-12-04 ENCOUNTER — Telehealth: Payer: Self-pay | Admitting: Cardiovascular Disease

## 2016-12-04 DIAGNOSIS — E785 Hyperlipidemia, unspecified: Secondary | ICD-10-CM

## 2016-12-04 DIAGNOSIS — I482 Chronic atrial fibrillation, unspecified: Secondary | ICD-10-CM

## 2016-12-04 MED ORDER — SIMVASTATIN 40 MG PO TABS: 40.0000 mg | ORAL_TABLET | Freq: Every day | ORAL | 3 refills | Status: DC

## 2016-12-04 MED ORDER — ATENOLOL 100 MG PO TABS: 200.0000 mg | ORAL_TABLET | Freq: Every day | ORAL | 3 refills | Status: DC

## 2016-12-04 NOTE — Telephone Encounter (Signed)
Spoke with pt, Refill sent to the pharmacy electronically.  

## 2016-12-04 NOTE — Telephone Encounter (Signed)
pt calling needing help with refills of Atenolol and Simvastatin to [facility] , he states they have messed up his prescriptions--pt requesting call when called in  [phone]

## 2016-12-09 ENCOUNTER — Ambulatory Visit (INDEPENDENT_AMBULATORY_CARE_PROVIDER_SITE_OTHER): Payer: Medicare Other | Admitting: Pharmacist Clinician (PhC)/ Clinical Pharmacy Specialist

## 2016-12-09 DIAGNOSIS — I482 Chronic atrial fibrillation, unspecified: Secondary | ICD-10-CM

## 2016-12-09 DIAGNOSIS — Z7901 Long term (current) use of anticoagulants: Secondary | ICD-10-CM

## 2016-12-09 DIAGNOSIS — I639 Cerebral infarction, unspecified: Secondary | ICD-10-CM

## 2017-01-01 ENCOUNTER — Other Ambulatory Visit: Payer: Self-pay | Admitting: Cardiovascular Disease

## 2017-01-01 DIAGNOSIS — Z7901 Long term (current) use of anticoagulants: Secondary | ICD-10-CM | POA: Diagnosis not present

## 2017-01-01 LAB — PROTIME-INR
INR: 3 — ABNORMAL HIGH
Prothrombin Time: 30.8 s — ABNORMAL HIGH (ref 9.0–11.5)

## 2017-01-04 ENCOUNTER — Ambulatory Visit (INDEPENDENT_AMBULATORY_CARE_PROVIDER_SITE_OTHER): Payer: Self-pay | Admitting: Pharmacist Clinician (PhC)/ Clinical Pharmacy Specialist

## 2017-01-04 DIAGNOSIS — I639 Cerebral infarction, unspecified: Secondary | ICD-10-CM

## 2017-01-04 DIAGNOSIS — I482 Chronic atrial fibrillation, unspecified: Secondary | ICD-10-CM

## 2017-01-04 DIAGNOSIS — Z7901 Long term (current) use of anticoagulants: Secondary | ICD-10-CM

## 2017-02-01 ENCOUNTER — Other Ambulatory Visit: Payer: Self-pay | Admitting: Cardiovascular Disease

## 2017-02-01 DIAGNOSIS — Z7901 Long term (current) use of anticoagulants: Secondary | ICD-10-CM | POA: Diagnosis not present

## 2017-02-02 ENCOUNTER — Ambulatory Visit (INDEPENDENT_AMBULATORY_CARE_PROVIDER_SITE_OTHER): Payer: Medicare Other | Admitting: Pharmacist

## 2017-02-02 DIAGNOSIS — I482 Chronic atrial fibrillation, unspecified: Secondary | ICD-10-CM

## 2017-02-02 DIAGNOSIS — I639 Cerebral infarction, unspecified: Secondary | ICD-10-CM

## 2017-02-02 DIAGNOSIS — Z7901 Long term (current) use of anticoagulants: Secondary | ICD-10-CM

## 2017-02-02 LAB — PROTIME-INR
INR: 3.1 — ABNORMAL HIGH
PROTHROMBIN TIME: 31 s — AB (ref 9.0–11.5)

## 2017-02-11 DIAGNOSIS — G589 Mononeuropathy, unspecified: Secondary | ICD-10-CM | POA: Diagnosis not present

## 2017-02-11 DIAGNOSIS — Z0000 Encounter for general adult medical examination without abnormal findings: Secondary | ICD-10-CM | POA: Diagnosis not present

## 2017-02-11 DIAGNOSIS — F17200 Nicotine dependence, unspecified, uncomplicated: Secondary | ICD-10-CM | POA: Diagnosis not present

## 2017-02-11 DIAGNOSIS — E782 Mixed hyperlipidemia: Secondary | ICD-10-CM | POA: Diagnosis not present

## 2017-02-11 DIAGNOSIS — Z8673 Personal history of transient ischemic attack (TIA), and cerebral infarction without residual deficits: Secondary | ICD-10-CM | POA: Diagnosis not present

## 2017-02-11 DIAGNOSIS — I10 Essential (primary) hypertension: Secondary | ICD-10-CM | POA: Diagnosis not present

## 2017-02-11 DIAGNOSIS — I482 Chronic atrial fibrillation: Secondary | ICD-10-CM | POA: Diagnosis not present

## 2017-03-05 ENCOUNTER — Other Ambulatory Visit: Payer: Self-pay | Admitting: Cardiovascular Disease

## 2017-03-05 DIAGNOSIS — Z7901 Long term (current) use of anticoagulants: Secondary | ICD-10-CM | POA: Diagnosis not present

## 2017-03-05 LAB — PROTIME-INR
INR: 3.8 — AB
PROTHROMBIN TIME: 40.2 s — AB (ref 9.0–11.5)

## 2017-03-09 ENCOUNTER — Ambulatory Visit (INDEPENDENT_AMBULATORY_CARE_PROVIDER_SITE_OTHER): Payer: Medicare Other | Admitting: Pharmacist Clinician (PhC)/ Clinical Pharmacy Specialist

## 2017-03-09 DIAGNOSIS — I482 Chronic atrial fibrillation, unspecified: Secondary | ICD-10-CM

## 2017-03-09 DIAGNOSIS — I639 Cerebral infarction, unspecified: Secondary | ICD-10-CM

## 2017-03-09 DIAGNOSIS — Z7901 Long term (current) use of anticoagulants: Secondary | ICD-10-CM

## 2017-04-01 ENCOUNTER — Telehealth: Payer: Self-pay | Admitting: Pharmacist Clinician (PhC)/ Clinical Pharmacy Specialist

## 2017-04-01 NOTE — Telephone Encounter (Signed)
Coumadin letter 

## 2017-04-02 ENCOUNTER — Other Ambulatory Visit: Payer: Self-pay | Admitting: Cardiovascular Disease

## 2017-04-02 DIAGNOSIS — Z7901 Long term (current) use of anticoagulants: Secondary | ICD-10-CM | POA: Diagnosis not present

## 2017-04-02 LAB — PROTIME-INR
INR: 3 — ABNORMAL HIGH
Prothrombin Time: 31 s — ABNORMAL HIGH (ref 9.0–11.5)

## 2017-04-06 ENCOUNTER — Ambulatory Visit (INDEPENDENT_AMBULATORY_CARE_PROVIDER_SITE_OTHER): Payer: Medicare Other | Admitting: Pharmacist Clinician (PhC)/ Clinical Pharmacy Specialist

## 2017-04-06 DIAGNOSIS — I639 Cerebral infarction, unspecified: Secondary | ICD-10-CM | POA: Diagnosis not present

## 2017-04-06 DIAGNOSIS — Z7901 Long term (current) use of anticoagulants: Secondary | ICD-10-CM | POA: Diagnosis not present

## 2017-04-06 DIAGNOSIS — I482 Chronic atrial fibrillation, unspecified: Secondary | ICD-10-CM

## 2017-05-07 ENCOUNTER — Other Ambulatory Visit: Payer: Self-pay | Admitting: Cardiovascular Disease

## 2017-05-07 DIAGNOSIS — Z7901 Long term (current) use of anticoagulants: Secondary | ICD-10-CM | POA: Diagnosis not present

## 2017-05-07 LAB — PROTIME-INR
INR: 2.8 — AB
PROTHROMBIN TIME: 29.8 s — AB (ref 9.0–11.5)

## 2017-05-12 ENCOUNTER — Ambulatory Visit (INDEPENDENT_AMBULATORY_CARE_PROVIDER_SITE_OTHER): Payer: Medicare Other | Admitting: Pharmacist

## 2017-05-12 DIAGNOSIS — I639 Cerebral infarction, unspecified: Secondary | ICD-10-CM

## 2017-05-12 DIAGNOSIS — I482 Chronic atrial fibrillation, unspecified: Secondary | ICD-10-CM

## 2017-05-12 DIAGNOSIS — Z7901 Long term (current) use of anticoagulants: Secondary | ICD-10-CM | POA: Diagnosis not present

## 2017-06-05 ENCOUNTER — Other Ambulatory Visit: Payer: Self-pay | Admitting: Cardiovascular Disease

## 2017-06-05 DIAGNOSIS — Z7901 Long term (current) use of anticoagulants: Secondary | ICD-10-CM | POA: Diagnosis not present

## 2017-06-06 LAB — PROTIME-INR
INR: 3 — ABNORMAL HIGH
Prothrombin Time: 31 s — ABNORMAL HIGH (ref 9.0–11.5)

## 2017-06-07 ENCOUNTER — Ambulatory Visit (INDEPENDENT_AMBULATORY_CARE_PROVIDER_SITE_OTHER): Payer: Medicare Other | Admitting: Pharmacist Clinician (PhC)/ Clinical Pharmacy Specialist

## 2017-06-07 DIAGNOSIS — I482 Chronic atrial fibrillation, unspecified: Secondary | ICD-10-CM

## 2017-06-07 DIAGNOSIS — Z7901 Long term (current) use of anticoagulants: Secondary | ICD-10-CM

## 2017-06-07 DIAGNOSIS — I639 Cerebral infarction, unspecified: Secondary | ICD-10-CM | POA: Diagnosis not present

## 2017-07-05 ENCOUNTER — Other Ambulatory Visit: Payer: Self-pay | Admitting: Cardiovascular Disease

## 2017-07-05 DIAGNOSIS — Z7901 Long term (current) use of anticoagulants: Secondary | ICD-10-CM | POA: Diagnosis not present

## 2017-07-06 ENCOUNTER — Ambulatory Visit (INDEPENDENT_AMBULATORY_CARE_PROVIDER_SITE_OTHER): Payer: Medicare Other | Admitting: Pharmacist Clinician (PhC)/ Clinical Pharmacy Specialist

## 2017-07-06 DIAGNOSIS — Z7901 Long term (current) use of anticoagulants: Secondary | ICD-10-CM

## 2017-07-06 DIAGNOSIS — I639 Cerebral infarction, unspecified: Secondary | ICD-10-CM

## 2017-07-06 DIAGNOSIS — I482 Chronic atrial fibrillation, unspecified: Secondary | ICD-10-CM

## 2017-07-06 LAB — PROTIME-INR
INR: 1.8 — ABNORMAL HIGH
PROTHROMBIN TIME: 18.8 s — AB (ref 9.0–11.5)

## 2017-08-05 ENCOUNTER — Other Ambulatory Visit: Payer: Self-pay | Admitting: Cardiovascular Disease

## 2017-08-05 DIAGNOSIS — Z7901 Long term (current) use of anticoagulants: Secondary | ICD-10-CM | POA: Diagnosis not present

## 2017-08-05 LAB — PROTIME-INR
INR: 2.7 — ABNORMAL HIGH
Prothrombin Time: 28.6 s — ABNORMAL HIGH (ref 9.0–11.5)

## 2017-08-06 ENCOUNTER — Ambulatory Visit (INDEPENDENT_AMBULATORY_CARE_PROVIDER_SITE_OTHER): Payer: Medicare Other | Admitting: Pharmacist Clinician (PhC)/ Clinical Pharmacy Specialist

## 2017-08-06 DIAGNOSIS — Z7901 Long term (current) use of anticoagulants: Secondary | ICD-10-CM

## 2017-08-06 DIAGNOSIS — I482 Chronic atrial fibrillation, unspecified: Secondary | ICD-10-CM

## 2017-08-06 DIAGNOSIS — I639 Cerebral infarction, unspecified: Secondary | ICD-10-CM

## 2017-09-02 ENCOUNTER — Other Ambulatory Visit: Payer: Self-pay | Admitting: Cardiovascular Disease

## 2017-09-02 DIAGNOSIS — E785 Hyperlipidemia, unspecified: Secondary | ICD-10-CM

## 2017-09-02 DIAGNOSIS — I482 Chronic atrial fibrillation, unspecified: Secondary | ICD-10-CM

## 2017-09-02 NOTE — Telephone Encounter (Signed)
REFILL 

## 2017-09-03 ENCOUNTER — Other Ambulatory Visit: Payer: Self-pay | Admitting: Cardiovascular Disease

## 2017-09-03 DIAGNOSIS — Z7901 Long term (current) use of anticoagulants: Secondary | ICD-10-CM | POA: Diagnosis not present

## 2017-09-04 LAB — PROTIME-INR
INR: 2.5 — ABNORMAL HIGH
PROTHROMBIN TIME: 25.8 s — AB (ref 9.0–11.5)

## 2017-10-01 ENCOUNTER — Other Ambulatory Visit: Payer: Self-pay | Admitting: Cardiovascular Disease

## 2017-10-01 DIAGNOSIS — Z7901 Long term (current) use of anticoagulants: Secondary | ICD-10-CM | POA: Diagnosis not present

## 2017-10-02 LAB — PROTIME-INR
INR: 2.4 — AB
Prothrombin Time: 24.9 s — ABNORMAL HIGH (ref 9.0–11.5)

## 2017-10-04 ENCOUNTER — Ambulatory Visit (INDEPENDENT_AMBULATORY_CARE_PROVIDER_SITE_OTHER): Payer: Medicare Other | Admitting: Pharmacist

## 2017-10-04 DIAGNOSIS — I639 Cerebral infarction, unspecified: Secondary | ICD-10-CM

## 2017-10-04 DIAGNOSIS — Z7901 Long term (current) use of anticoagulants: Secondary | ICD-10-CM | POA: Diagnosis not present

## 2017-10-04 DIAGNOSIS — I482 Chronic atrial fibrillation, unspecified: Secondary | ICD-10-CM

## 2017-11-01 ENCOUNTER — Other Ambulatory Visit: Payer: Self-pay | Admitting: Cardiovascular Disease

## 2017-11-01 DIAGNOSIS — Z7901 Long term (current) use of anticoagulants: Secondary | ICD-10-CM | POA: Diagnosis not present

## 2017-11-02 LAB — PROTIME-INR
INR: 2.4 — AB
PROTHROMBIN TIME: 25.5 s — AB (ref 9.0–11.5)

## 2017-11-03 ENCOUNTER — Ambulatory Visit (INDEPENDENT_AMBULATORY_CARE_PROVIDER_SITE_OTHER): Payer: Medicare Other | Admitting: Pharmacist Clinician (PhC)/ Clinical Pharmacy Specialist

## 2017-11-03 DIAGNOSIS — I639 Cerebral infarction, unspecified: Secondary | ICD-10-CM | POA: Diagnosis not present

## 2017-11-03 DIAGNOSIS — I482 Chronic atrial fibrillation, unspecified: Secondary | ICD-10-CM

## 2017-11-03 DIAGNOSIS — Z7901 Long term (current) use of anticoagulants: Secondary | ICD-10-CM

## 2017-12-02 ENCOUNTER — Encounter: Payer: Self-pay | Admitting: Cardiovascular Disease

## 2017-12-02 ENCOUNTER — Ambulatory Visit (INDEPENDENT_AMBULATORY_CARE_PROVIDER_SITE_OTHER): Payer: Medicare Other | Admitting: Cardiovascular Disease

## 2017-12-02 ENCOUNTER — Other Ambulatory Visit: Payer: Self-pay | Admitting: Pharmacist Clinician (PhC)/ Clinical Pharmacy Specialist

## 2017-12-02 ENCOUNTER — Ambulatory Visit (INDEPENDENT_AMBULATORY_CARE_PROVIDER_SITE_OTHER): Payer: Medicare Other | Admitting: Pharmacist Clinician (PhC)/ Clinical Pharmacy Specialist

## 2017-12-02 VITALS — BP 120/80 | HR 71 | Ht 76.0 in | Wt 322.2 lb

## 2017-12-02 DIAGNOSIS — E785 Hyperlipidemia, unspecified: Secondary | ICD-10-CM

## 2017-12-02 DIAGNOSIS — Z7901 Long term (current) use of anticoagulants: Secondary | ICD-10-CM

## 2017-12-02 DIAGNOSIS — I482 Chronic atrial fibrillation, unspecified: Secondary | ICD-10-CM

## 2017-12-02 DIAGNOSIS — Z79899 Other long term (current) drug therapy: Secondary | ICD-10-CM

## 2017-12-02 DIAGNOSIS — R600 Localized edema: Secondary | ICD-10-CM

## 2017-12-02 DIAGNOSIS — I639 Cerebral infarction, unspecified: Secondary | ICD-10-CM | POA: Diagnosis not present

## 2017-12-02 LAB — CBC
HEMATOCRIT: 47.9 % (ref 37.5–51.0)
HEMOGLOBIN: 16.5 g/dL (ref 13.0–17.7)
MCH: 32.5 pg (ref 26.6–33.0)
MCHC: 34.4 g/dL (ref 31.5–35.7)
MCV: 95 fL (ref 79–97)
Platelets: 191 10*3/uL (ref 150–379)
RBC: 5.07 x10E6/uL (ref 4.14–5.80)
RDW: 14 % (ref 12.3–15.4)
WBC: 6.8 10*3/uL (ref 3.4–10.8)

## 2017-12-02 LAB — COMPREHENSIVE METABOLIC PANEL
A/G RATIO: 1.4 (ref 1.2–2.2)
ALT: 14 IU/L (ref 0–44)
AST: 16 IU/L (ref 0–40)
Albumin: 4.4 g/dL (ref 3.6–4.8)
Alkaline Phosphatase: 97 IU/L (ref 39–117)
BILIRUBIN TOTAL: 0.5 mg/dL (ref 0.0–1.2)
BUN/Creatinine Ratio: 12 (ref 10–24)
BUN: 11 mg/dL (ref 8–27)
CALCIUM: 9.6 mg/dL (ref 8.6–10.2)
CHLORIDE: 101 mmol/L (ref 96–106)
CO2: 25 mmol/L (ref 20–29)
Creatinine, Ser: 0.9 mg/dL (ref 0.76–1.27)
GFR, EST AFRICAN AMERICAN: 105 mL/min/{1.73_m2} (ref >59)
GFR, EST NON AFRICAN AMERICAN: 91 mL/min/{1.73_m2} (ref >59)
GLUCOSE: 91 mg/dL (ref 65–99)
Globulin, Total: 3.1 g/dL (ref 1.5–4.5)
POTASSIUM: 5.2 mmol/L (ref 3.5–5.2)
Sodium: 141 mmol/L (ref 134–144)
TOTAL PROTEIN: 7.5 g/dL (ref 6.0–8.5)

## 2017-12-02 LAB — LIPID PANEL
CHOL/HDL RATIO: 3 ratio (ref 0.0–5.0)
Cholesterol, Total: 128 mg/dL (ref 100–199)
HDL: 42 mg/dL (ref >39)
LDL Calculated: 75 mg/dL (ref 0–99)
Triglycerides: 57 mg/dL (ref 0–149)
VLDL Cholesterol Cal: 11 mg/dL (ref 5–40)

## 2017-12-02 LAB — POCT INR: INR: 2.2

## 2017-12-02 LAB — TSH: TSH: 1.8 u[IU]/mL (ref 0.450–4.500)

## 2017-12-02 MED ORDER — DIGOXIN 125 MCG PO TABS: 125.0000 ug | ORAL_TABLET | Freq: Every day | ORAL | 3 refills | Status: DC

## 2017-12-02 MED ORDER — HYDROCHLOROTHIAZIDE 12.5 MG PO CAPS: 12.5000 mg | ORAL_CAPSULE | ORAL | 3 refills | Status: DC | PRN

## 2017-12-02 MED ORDER — SIMVASTATIN 40 MG PO TABS: 40.0000 mg | ORAL_TABLET | Freq: Every day | ORAL | 3 refills | Status: DC

## 2017-12-02 MED ORDER — WARFARIN SODIUM 4 MG PO TABS
ORAL_TABLET | ORAL | 1 refills | Status: DC
Start: 2017-12-02 — End: 2018-06-02

## 2017-12-02 MED ORDER — ATENOLOL 100 MG PO TABS: 200.0000 mg | ORAL_TABLET | Freq: Every day | ORAL | 3 refills | Status: DC

## 2017-12-02 NOTE — Patient Instructions (Signed)
Medication Instructions:  START HCTZ 12.5 mg AS NEEDED for swelling  Labwork: TODAY (CMET, CBC, Lipid, TSH)  Follow-Up: Your physician wants you to follow-up in: 12 months with Dr. Claiborne Billings. You will receive a reminder letter in the mail two months in advance. If you don't receive a letter, please call our office to schedule the follow-up appointment.   Any Other Special Instructions Will Be Listed Below (If Applicable).     If you need a refill on your cardiac medications before your next appointment, please call your pharmacy.

## 2017-12-02 NOTE — Progress Notes (Signed)
Patient ID: Cory Petty, male   DOB: 1956/05/17, 62 y.o.   MRN: [id number]     HPI: Cory Petty is a 62 y.o. male who presents for a one year cardiology evaluation  [name] has a history of permanent atrial fibrillation and has been on chronic Coumadin therapy.  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  He has a long-standing history of tobacco use and currently is still smoking 1 pack per day. He has a history of hyperlipidemia, and moderate obesity.  A sleep study in December 2011 revealed mild increased upper airways resistance syndrome with mild sleep apnea, only during REM sleep and he is not on CPAP therapy.  He has been on disability since his stroke and after his wife's death.  He is a former Administrator.  Over the past year, Cory Petty any change in symptoms.   He walks a aproximately  3000 steps  For 3-4 days per week.  He is unaware of any chest pressure.  He Petty shortness of breath.  He is unaware of palpitations.  He is regular in getting his Coumadin checked.    He underwent lipid studies in May 2017 and on simvastatin 40 mg LDL was 70, total cholesterol 125, HDL 45, triglycerides 50.   The past year, he is remained relatively stable.  Been disabled since 2009 as result of his stroke.  He has some mild residual left and leg weakness.  Has had some weight fluctuation and continues to be obese.  He continues to be on warfarin for anticoagulation.  He has not had interest in Cory Petty therapy due to increased cost.  He continues to be on atenolol 200 mg daily, digoxin 0.125 mg for rate control of his permanent atrial fibrillation.  His peripheral neuropathy.  Is treated with gabapentin 300 mg daily.  He continues with INR checks at least on a monthly basis.  He Petty bleeding.  He has noticed some mild lower extremity edema.  He Petty any chest pain.  He Petty PND orthopnea.  He presents for evaluation.   Past Medical History:    Diagnosis Date  . CVA (cerebral vascular accident) ([place])    Remote, small  . Hyperlipidemia   . Hypersomnia with sleep apnea    Sleep Study (07/16/2010) AHI-2.3/hr, AHI during REM-9.4/hr, RDI-5.3/hr, REM during REM-12.8/hr, AVG O2 during REM and NREM was 93.0%  . Permanent atrial fibrillation [facility])     Past Surgical History:  Procedure Laterality Date  . CARDIOVASCULAR STRESS TEST  06/18/2008   Perfusion defect in the inferior and apical myocardial region consistent with diaphragmatic attenuation. Remaining myocardium presents normal myocardial perfusion with no evidence of ischemia or infarct. EKG negative for ischemia.  . 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 in configuration or arterial feeders or venous drainage  . TRANSTHORACIC ECHOCARDIOGRAM  08/01/2010   EF >55%, LA mild-moderately dilated,     No Known Allergies  Current Outpatient Medications  Medication Sig Dispense Refill  . acetaminophen (TYLENOL) 500 MG tablet Take 500 mg by mouth as needed for headache.     Marland Kitchen aspirin EC 81 MG tablet Take 81 mg by mouth daily.    Marland Kitchen atenolol (TENORMIN) 100 MG tablet Take 2 tablets (200 mg total) by mouth daily. 180 tablet 3  . digoxin (DIGOX) 0.125 MG tablet Take 1 tablet (125 mcg total) by mouth daily. 90 tablet  3  . gabapentin (NEURONTIN) 300 MG capsule Take 300 mg by mouth daily.    . potassium gluconate 595 MG TABS tablet Take 595 mg by mouth 2 (two) times daily.     . simvastatin (ZOCOR) 40 MG tablet Take 1 tablet (40 mg total) by mouth daily. 90 tablet 3  . hydrochlorothiazide (MICROZIDE) 12.5 MG capsule Take 1 capsule (12.5 mg total) by mouth as needed. 30 capsule 3  . warfarin (COUMADIN) 4 MG tablet Take 1 tablet by mouth daily or as directed by coumadin clinic 90 tablet 1   No current facility-administered medications for this visit.     Social History   Socioeconomic History  . Marital status: Widowed    Spouse  name: Not on file  . Number of children: Not on file  . Years of education: Not on file  . Highest education level: Not on file  Occupational History  . Not on file  Social Needs  . Financial resource strain: Not on file  . Food insecurity:    Worry: Not on file    Inability: Not on file  . Transportation needs:    Medical: Not on file    Non-medical: Not on file  Tobacco Use  . Smoking status: Current Every Day Smoker    Packs/day: 1.00    Years: 29.00    Pack years: 29.00    Types: Cigarettes  . Smokeless tobacco: Never Used  Substance and Sexual Activity  . Alcohol use: Yes    Comment: 2 beers per month  . Drug use: Not on file  . Sexual activity: Not on file  Lifestyle  . Physical activity:    Days per week: Not on file    Minutes per session: Not on file  . Stress: Not on file  Relationships  . Social connections:    Talks on phone: Not on file    Gets together: Not on file    Attends religious service: Not on file    Active member of club or organization: Not on file    Attends meetings of clubs or organizations: Not on file    Relationship status: Not on file  . Intimate partner violence:    Fear of current or ex partner: Not on file    Emotionally abused: Not on file    Physically abused: Not on file    Forced sexual activity: Not on file  Other Topics Concern  . Not on file  Social History Narrative  . Not on file   Socially, he is widowed.  He has no children.  Does smoke tobacco one pack per day.  He is a former Administrator.  Family History  Problem Relation Age of Onset  . Cancer Mother        Lung cancer  . Cancer Father        Stomach cancer  . Cancer Brother     ROS General: Negative; No fevers, chills, or night sweats;  Positive for obesity HEENT: Negative; No changes in vision or hearing, sinus congestion, difficulty swallowing Pulmonary: Negative; No cough, wheezing, shortness of breath, hemoptysis Cardiovascular:  See HPI GI:  Negative; No nausea, vomiting, diarrhea, or abdominal pain GU: Negative; No dysuria, hematuria, or difficulty voiding Musculoskeletal: Negative; no myalgias, joint pain, or weakness Hematologic/Oncology: Negative; no easy bruising, bleeding Endocrine: Negative; no heat/cold intolerance; no diabetes Neuro: stroke in 2009 Skin: Negative; No rashes or skin lesions Psychiatric: Negative; No behavioral problems, depression Sleep: Negative; No snoring,  daytime sleepiness, hypersomnolence, bruxism, restless legs, hypnogognic hallucinations, no cataplexy Other comprehensive 14 point system review is negative.  PE BP 120/80   Pulse 71   Ht 6' 4"  (1.93 m)   Wt (!) 322 lb 3.2 oz (146.1 kg)   BMI 39.22 kg/m    Repeat blood pressure 126/80  Wt Readings from Last 3 Encounters:  12/02/17 (!) 322 lb 3.2 oz (146.1 kg)  11/25/16 (!) 338 lb 6.4 oz (153.5 kg)  11/22/15 (!) 322 lb (146.1 kg)   General: Alert, oriented, no distress.  Skin: normal turgor, no rashes, warm and dry HEENT: Normocephalic, atraumatic. Pupils equal round and reactive to light; sclera anicteric; extraocular muscles intact;  Nose without nasal septal hypertrophy Mouth/Parynx benign; Mallinpatti scale 3 Neck: No JVD, no carotid bruits; normal carotid upstroke Lungs: clear to ausculatation and percussion; no wheezing or rales Chest wall: without tenderness to palpitation Heart: PMI not displaced, RRR, s1 s2 normal, 1/6 systolic murmur, no diastolic murmur, no rubs, gallops, thrills, or heaves Abdomen: soft, nontender; no hepatosplenomehaly, BS+; abdominal aorta nontender and not dilated by palpation. Back: no CVA tenderness Pulses 2+ Musculoskeletal: full range of motion, normal strength, no joint deformities Extremities: Plus lower extremity edema;  no clubbing cyanosis, Homan's sign negative  Neurologic: grossly nonfocal; Cranial nerves grossly wnl Psychologic: Normal mood and affect   ECG (independently read by me):  Atrial fibrillation at 71 bpm.  LVH by voltage.  Left axis deviation.  Normal intervals.  April 2018 ECG (independently read by me): Atrial fibrillation with a ventricular rate at 67.  Nonspecific interventricular conduction delay, late.  April 2016 ECG (independently read by me):  Atrial fibrillation with a controlled ventricular response at 59 bpm.  Normal intervals.   April 2015 ECG (independently read by me): Atrial fibrillation at 67 beats per minute.  Possible LVH by voltage criteria in aVL.  No significant ST-T change.  LABS:  BMP Latest Ref Rng & Units 12/02/2017 12/03/2016 12/04/2015  Glucose 65 - 99 mg/dL 91 97 89  BUN 8 - 27 mg/dL 11 13 16   Creatinine 0.76 - 1.27 mg/dL 0.90 0.86 0.90  BUN/Creat Ratio 10 - 24 12 - -  Sodium 134 - 144 mmol/L 141 141 142  Potassium 3.5 - 5.2 mmol/L 5.2 4.6 4.9  Chloride 96 - 106 mmol/L 101 106 104  CO2 20 - 29 mmol/L 25 28 29   Calcium 8.6 - 10.2 mg/dL 9.6 8.8 9.3    Hepatic Function Latest Ref Rng & Units 12/02/2017 12/03/2016 12/04/2015  Total Protein 6.0 - 8.5 g/dL 7.5 6.8 6.9  Albumin 3.6 - 4.8 g/dL 4.4 3.8 4.1  AST 0 - 40 IU/L 16 17 15   ALT 0 - 44 IU/L 14 14 13   Alk Phosphatase 39 - 117 IU/L 97 71 81  Total Bilirubin 0.0 - 1.2 mg/dL 0.5 0.6 0.6    CBC Latest Ref Rng & Units 12/02/2017 12/03/2016 12/04/2015  WBC 3.4 - 10.8 x10E3/uL 6.8 6.2 6.8  Hemoglobin 13.0 - 17.7 g/dL 16.5 14.3 16.0  Hematocrit 37.5 - 51.0 % 47.9 43.1 48.3  Platelets 150 - 379 x10E3/uL 191 202 209   Lab Results  Component Value Date   TSH 1.800 12/02/2017    BNP No results found for: PROBNP  Lipid Panel     Component Value Date/Time   CHOL 128 12/02/2017 1104   TRIG 57 12/02/2017 1104   HDL 42 12/02/2017 1104   CHOLHDL 3.0 12/02/2017 1104   CHOLHDL 2.7 12/03/2016 1020  VLDL 14 12/03/2016 1020   LDLCALC 75 12/02/2017 1104   INR today 2.2  RADIOLOGY: No results found.  IMPRESSION:  1. Chronic atrial fibrillation ([place])   2. Long term current use of  anticoagulant therapy   3. Lower extremity edema   4. Hyperlipidemia LDL goal <70   5. Medication management   6. Morbid obesity ([place])     ASSESSMENT AND PLAN: Mr. Willia Lampert  Is a 62 year old gentleman with a long-standing history of permanent atrial fibrillation for which he is on  chronic Coumadin therapy,  He suffered a small CVA in June 2009,  Since that time, he is retired and has been on disability.  ECG remains stable with controlled ventricular rate of his atrial fibrillation on atenolol 200 mg daily in addition to digoxin 0.125 mg.  Previously was morbidly obese.  Since I saw him one year ago his weight is reduced from 338 down to 322 which is a 16 pound weight loss.  BMI is now 39.2.  Again discussed exercise and weight loss.  He Petty any issues with sleep.  He needs to be on simvastatin for hyperlipidemia.  LDL level in May 2018 was 59.  He has swelling in his lower extremities.  I am giving him a prescription for HCTZ 12.5 mg to take on an as-needed basis.  INR is therapeutic.  There is no bleeding on warfarin.  I will see him in 1 year for reevaluation.    Time spent: 25 minutes  Troy Sine, MD, [facility]  12/04/2017 11:01 AM

## 2017-12-02 NOTE — Patient Instructions (Signed)
Description   Continue with 1 tablet daily except 1/2 tablet each Friday.  Repeat INR in 4 weeks    [place] RN - works each Monday and Wednesday  Ph [place]

## 2017-12-04 ENCOUNTER — Encounter: Payer: Self-pay | Admitting: Cardiovascular Disease

## 2018-01-03 ENCOUNTER — Other Ambulatory Visit: Payer: Self-pay | Admitting: Cardiovascular Disease

## 2018-01-03 DIAGNOSIS — Z7901 Long term (current) use of anticoagulants: Secondary | ICD-10-CM | POA: Diagnosis not present

## 2018-01-03 LAB — PROTIME-INR
INR: 1.9 — ABNORMAL HIGH
Prothrombin Time: 20.3 s — ABNORMAL HIGH (ref 9.0–11.5)

## 2018-01-04 ENCOUNTER — Ambulatory Visit (INDEPENDENT_AMBULATORY_CARE_PROVIDER_SITE_OTHER): Payer: Medicare Other | Admitting: Pharmacist

## 2018-01-04 DIAGNOSIS — I482 Chronic atrial fibrillation, unspecified: Secondary | ICD-10-CM

## 2018-01-04 DIAGNOSIS — Z7901 Long term (current) use of anticoagulants: Secondary | ICD-10-CM | POA: Diagnosis not present

## 2018-01-24 DIAGNOSIS — Z8673 Personal history of transient ischemic attack (TIA), and cerebral infarction without residual deficits: Secondary | ICD-10-CM | POA: Diagnosis not present

## 2018-01-24 DIAGNOSIS — G9009 Other idiopathic peripheral autonomic neuropathy: Secondary | ICD-10-CM | POA: Diagnosis not present

## 2018-01-24 DIAGNOSIS — I10 Essential (primary) hypertension: Secondary | ICD-10-CM | POA: Diagnosis not present

## 2018-01-24 DIAGNOSIS — I482 Chronic atrial fibrillation: Secondary | ICD-10-CM | POA: Diagnosis not present

## 2018-01-24 DIAGNOSIS — F17200 Nicotine dependence, unspecified, uncomplicated: Secondary | ICD-10-CM | POA: Diagnosis not present

## 2018-01-24 DIAGNOSIS — E782 Mixed hyperlipidemia: Secondary | ICD-10-CM | POA: Diagnosis not present

## 2018-01-24 DIAGNOSIS — M25512 Pain in left shoulder: Secondary | ICD-10-CM | POA: Diagnosis not present

## 2018-01-24 DIAGNOSIS — Z6839 Body mass index (BMI) 39.0-39.9, adult: Secondary | ICD-10-CM | POA: Diagnosis not present

## 2018-02-02 ENCOUNTER — Ambulatory Visit (INDEPENDENT_AMBULATORY_CARE_PROVIDER_SITE_OTHER): Payer: Medicare Other | Admitting: Pharmacist

## 2018-02-02 ENCOUNTER — Other Ambulatory Visit: Payer: Self-pay | Admitting: Cardiovascular Disease

## 2018-02-02 DIAGNOSIS — Z7901 Long term (current) use of anticoagulants: Secondary | ICD-10-CM | POA: Diagnosis not present

## 2018-02-02 DIAGNOSIS — I482 Chronic atrial fibrillation, unspecified: Secondary | ICD-10-CM

## 2018-02-02 LAB — PROTIME-INR
INR: 3 — AB
Prothrombin Time: 31.1 s — ABNORMAL HIGH (ref 9.0–11.5)

## 2018-02-09 DIAGNOSIS — Z6839 Body mass index (BMI) 39.0-39.9, adult: Secondary | ICD-10-CM | POA: Diagnosis not present

## 2018-02-09 DIAGNOSIS — Z8673 Personal history of transient ischemic attack (TIA), and cerebral infarction without residual deficits: Secondary | ICD-10-CM | POA: Diagnosis not present

## 2018-02-09 DIAGNOSIS — E782 Mixed hyperlipidemia: Secondary | ICD-10-CM | POA: Diagnosis not present

## 2018-02-09 DIAGNOSIS — Z6841 Body Mass Index (BMI) 40.0 and over, adult: Secondary | ICD-10-CM | POA: Diagnosis not present

## 2018-02-09 DIAGNOSIS — I482 Chronic atrial fibrillation: Secondary | ICD-10-CM | POA: Diagnosis not present

## 2018-02-09 DIAGNOSIS — I10 Essential (primary) hypertension: Secondary | ICD-10-CM | POA: Diagnosis not present

## 2018-02-09 DIAGNOSIS — M25512 Pain in left shoulder: Secondary | ICD-10-CM | POA: Diagnosis not present

## 2018-02-09 DIAGNOSIS — R42 Dizziness and giddiness: Secondary | ICD-10-CM | POA: Diagnosis not present

## 2018-02-09 DIAGNOSIS — R001 Bradycardia, unspecified: Secondary | ICD-10-CM | POA: Diagnosis not present

## 2018-02-09 DIAGNOSIS — G9009 Other idiopathic peripheral autonomic neuropathy: Secondary | ICD-10-CM | POA: Diagnosis not present

## 2018-02-09 DIAGNOSIS — F17200 Nicotine dependence, unspecified, uncomplicated: Secondary | ICD-10-CM | POA: Diagnosis not present

## 2018-02-16 DIAGNOSIS — M542 Cervicalgia: Secondary | ICD-10-CM | POA: Diagnosis not present

## 2018-02-16 DIAGNOSIS — M25512 Pain in left shoulder: Secondary | ICD-10-CM | POA: Diagnosis not present

## 2018-03-03 ENCOUNTER — Other Ambulatory Visit: Payer: Self-pay

## 2018-03-03 MED ORDER — DIGOXIN 125 MCG PO TABS: 125.0000 ug | ORAL_TABLET | Freq: Every day | ORAL | 3 refills | Status: DC

## 2018-03-04 ENCOUNTER — Other Ambulatory Visit: Payer: Self-pay | Admitting: Cardiovascular Disease

## 2018-03-04 ENCOUNTER — Ambulatory Visit (INDEPENDENT_AMBULATORY_CARE_PROVIDER_SITE_OTHER): Payer: Medicare Other | Admitting: Pharmacist

## 2018-03-04 DIAGNOSIS — I482 Chronic atrial fibrillation, unspecified: Secondary | ICD-10-CM

## 2018-03-04 DIAGNOSIS — Z7901 Long term (current) use of anticoagulants: Secondary | ICD-10-CM | POA: Diagnosis not present

## 2018-03-04 LAB — PROTIME-INR
INR: 3.7 — ABNORMAL HIGH
Prothrombin Time: 38.8 s — ABNORMAL HIGH (ref 9.0–11.5)

## 2018-03-09 DIAGNOSIS — M25512 Pain in left shoulder: Secondary | ICD-10-CM | POA: Diagnosis not present

## 2018-04-05 ENCOUNTER — Ambulatory Visit (INDEPENDENT_AMBULATORY_CARE_PROVIDER_SITE_OTHER): Payer: Medicare Other | Admitting: Pharmacist Clinician (PhC)/ Clinical Pharmacy Specialist

## 2018-04-05 ENCOUNTER — Other Ambulatory Visit: Payer: Self-pay | Admitting: Cardiovascular Disease

## 2018-04-05 DIAGNOSIS — I482 Chronic atrial fibrillation, unspecified: Secondary | ICD-10-CM

## 2018-04-05 DIAGNOSIS — Z7901 Long term (current) use of anticoagulants: Secondary | ICD-10-CM | POA: Diagnosis not present

## 2018-04-05 LAB — PROTIME-INR
INR: 3.2 — ABNORMAL HIGH
Prothrombin Time: 33.5 s — ABNORMAL HIGH (ref 9.0–11.5)

## 2018-05-05 ENCOUNTER — Other Ambulatory Visit: Payer: Self-pay | Admitting: Cardiovascular Disease

## 2018-05-05 DIAGNOSIS — Z7901 Long term (current) use of anticoagulants: Secondary | ICD-10-CM | POA: Diagnosis not present

## 2018-05-06 ENCOUNTER — Ambulatory Visit (INDEPENDENT_AMBULATORY_CARE_PROVIDER_SITE_OTHER): Payer: Medicare Other | Admitting: Pharmacist

## 2018-05-06 DIAGNOSIS — Z7901 Long term (current) use of anticoagulants: Secondary | ICD-10-CM | POA: Diagnosis not present

## 2018-05-06 LAB — PROTIME-INR
INR: 2.3 — ABNORMAL HIGH
Prothrombin Time: 23.8 s — ABNORMAL HIGH (ref 9.0–11.5)

## 2018-05-13 DIAGNOSIS — Z23 Encounter for immunization: Secondary | ICD-10-CM | POA: Diagnosis not present

## 2018-06-02 ENCOUNTER — Other Ambulatory Visit: Payer: Self-pay | Admitting: Cardiovascular Disease

## 2018-06-02 DIAGNOSIS — I482 Chronic atrial fibrillation, unspecified: Secondary | ICD-10-CM

## 2018-06-03 ENCOUNTER — Other Ambulatory Visit: Payer: Self-pay | Admitting: Cardiovascular Disease

## 2018-06-03 DIAGNOSIS — Z7901 Long term (current) use of anticoagulants: Secondary | ICD-10-CM | POA: Diagnosis not present

## 2018-06-03 LAB — PROTIME-INR
INR: 2.3 — AB
Prothrombin Time: 22.4 s — ABNORMAL HIGH (ref 9.0–11.5)

## 2018-06-06 ENCOUNTER — Ambulatory Visit (INDEPENDENT_AMBULATORY_CARE_PROVIDER_SITE_OTHER): Payer: Medicare Other | Admitting: Pharmacist

## 2018-06-06 DIAGNOSIS — Z7901 Long term (current) use of anticoagulants: Secondary | ICD-10-CM

## 2018-07-05 ENCOUNTER — Other Ambulatory Visit: Payer: Self-pay | Admitting: Cardiovascular Disease

## 2018-07-05 DIAGNOSIS — Z7901 Long term (current) use of anticoagulants: Secondary | ICD-10-CM | POA: Diagnosis not present

## 2018-07-05 LAB — PROTIME-INR
INR: 2.4 — ABNORMAL HIGH
PROTHROMBIN TIME: 23.3 s — AB (ref 9.0–11.5)

## 2018-07-07 ENCOUNTER — Ambulatory Visit (INDEPENDENT_AMBULATORY_CARE_PROVIDER_SITE_OTHER): Payer: Medicare Other | Admitting: Pharmacist

## 2018-07-07 DIAGNOSIS — Z7901 Long term (current) use of anticoagulants: Secondary | ICD-10-CM | POA: Diagnosis not present

## 2018-07-07 DIAGNOSIS — I4891 Unspecified atrial fibrillation: Secondary | ICD-10-CM | POA: Diagnosis not present

## 2018-08-05 ENCOUNTER — Other Ambulatory Visit: Payer: Self-pay | Admitting: Cardiovascular Disease

## 2018-08-05 DIAGNOSIS — Z7901 Long term (current) use of anticoagulants: Secondary | ICD-10-CM | POA: Diagnosis not present

## 2018-08-05 LAB — PROTIME-INR
INR: 2.1 — AB
PROTHROMBIN TIME: 20.4 s — AB (ref 9.0–11.5)

## 2018-08-08 ENCOUNTER — Ambulatory Visit (INDEPENDENT_AMBULATORY_CARE_PROVIDER_SITE_OTHER): Payer: Medicare Other | Admitting: Pharmacist Clinician (PhC)/ Clinical Pharmacy Specialist

## 2018-08-08 DIAGNOSIS — Z7901 Long term (current) use of anticoagulants: Secondary | ICD-10-CM

## 2018-08-08 DIAGNOSIS — I4891 Unspecified atrial fibrillation: Secondary | ICD-10-CM | POA: Diagnosis not present

## 2018-08-31 ENCOUNTER — Other Ambulatory Visit: Payer: Self-pay | Admitting: Cardiovascular Disease

## 2018-09-05 ENCOUNTER — Other Ambulatory Visit: Payer: Self-pay | Admitting: Cardiovascular Disease

## 2018-09-05 DIAGNOSIS — Z7901 Long term (current) use of anticoagulants: Secondary | ICD-10-CM | POA: Diagnosis not present

## 2018-09-05 LAB — PROTIME-INR
INR: 1.9 — AB
PROTHROMBIN TIME: 18.9 s — AB (ref 9.0–11.5)

## 2018-09-06 ENCOUNTER — Ambulatory Visit (INDEPENDENT_AMBULATORY_CARE_PROVIDER_SITE_OTHER): Payer: Medicare Other | Admitting: Pharmacist Clinician (PhC)/ Clinical Pharmacy Specialist

## 2018-09-06 DIAGNOSIS — I639 Cerebral infarction, unspecified: Secondary | ICD-10-CM

## 2018-09-06 DIAGNOSIS — I4891 Unspecified atrial fibrillation: Secondary | ICD-10-CM | POA: Diagnosis not present

## 2018-09-06 DIAGNOSIS — Z7901 Long term (current) use of anticoagulants: Secondary | ICD-10-CM

## 2018-10-04 ENCOUNTER — Other Ambulatory Visit: Payer: Self-pay | Admitting: Cardiovascular Disease

## 2018-10-04 DIAGNOSIS — Z7901 Long term (current) use of anticoagulants: Secondary | ICD-10-CM | POA: Diagnosis not present

## 2018-10-04 LAB — PROTIME-INR
INR: 1.9 — ABNORMAL HIGH
PROTHROMBIN TIME: 19 s — AB (ref 9.0–11.5)

## 2018-10-07 ENCOUNTER — Ambulatory Visit (INDEPENDENT_AMBULATORY_CARE_PROVIDER_SITE_OTHER): Payer: Medicare Other | Admitting: Pharmacist Clinician (PhC)/ Clinical Pharmacy Specialist

## 2018-10-07 DIAGNOSIS — I4891 Unspecified atrial fibrillation: Secondary | ICD-10-CM

## 2018-10-07 DIAGNOSIS — I639 Cerebral infarction, unspecified: Secondary | ICD-10-CM | POA: Diagnosis not present

## 2018-10-07 DIAGNOSIS — Z7901 Long term (current) use of anticoagulants: Secondary | ICD-10-CM | POA: Diagnosis not present

## 2018-11-04 ENCOUNTER — Other Ambulatory Visit: Payer: Self-pay | Admitting: Cardiovascular Disease

## 2018-11-04 DIAGNOSIS — Z7901 Long term (current) use of anticoagulants: Secondary | ICD-10-CM | POA: Diagnosis not present

## 2018-11-04 LAB — PROTIME-INR
INR: 2.4 — ABNORMAL HIGH
Prothrombin Time: 23.4 s — ABNORMAL HIGH (ref 9.0–11.5)

## 2018-11-07 ENCOUNTER — Ambulatory Visit (INDEPENDENT_AMBULATORY_CARE_PROVIDER_SITE_OTHER): Payer: Medicare Other | Admitting: Pharmacist

## 2018-11-07 DIAGNOSIS — I4891 Unspecified atrial fibrillation: Secondary | ICD-10-CM | POA: Diagnosis not present

## 2018-11-07 DIAGNOSIS — I639 Cerebral infarction, unspecified: Secondary | ICD-10-CM | POA: Diagnosis not present

## 2018-11-07 DIAGNOSIS — Z7901 Long term (current) use of anticoagulants: Secondary | ICD-10-CM | POA: Diagnosis not present

## 2018-11-07 NOTE — Patient Instructions (Signed)
Description   Called spoke with pt, advised to continue on same dosage 1 tablet daily except 1/2 tablet each Tuesdays.  Repeat INR in 4 weeks.

## 2018-11-30 ENCOUNTER — Other Ambulatory Visit: Payer: Self-pay | Admitting: Cardiovascular Disease

## 2018-11-30 DIAGNOSIS — I482 Chronic atrial fibrillation, unspecified: Secondary | ICD-10-CM

## 2018-11-30 NOTE — Telephone Encounter (Signed)
Simvastatin and atenolol refilled.

## 2018-12-05 ENCOUNTER — Telehealth: Payer: Medicare Other | Admitting: Cardiovascular Disease

## 2018-12-05 ENCOUNTER — Other Ambulatory Visit: Payer: Self-pay | Admitting: Cardiovascular Disease

## 2018-12-05 DIAGNOSIS — Z7901 Long term (current) use of anticoagulants: Secondary | ICD-10-CM | POA: Diagnosis not present

## 2018-12-06 LAB — PROTIME-INR
INR: 3.4 — ABNORMAL HIGH
Prothrombin Time: 32.2 s — ABNORMAL HIGH (ref 9.0–11.5)

## 2018-12-07 DIAGNOSIS — Z0000 Encounter for general adult medical examination without abnormal findings: Secondary | ICD-10-CM | POA: Diagnosis not present

## 2018-12-09 ENCOUNTER — Ambulatory Visit (INDEPENDENT_AMBULATORY_CARE_PROVIDER_SITE_OTHER): Payer: Medicare Other | Admitting: Pharmacist Clinician (PhC)/ Clinical Pharmacy Specialist

## 2018-12-09 DIAGNOSIS — I4891 Unspecified atrial fibrillation: Secondary | ICD-10-CM | POA: Diagnosis not present

## 2018-12-09 DIAGNOSIS — Z7901 Long term (current) use of anticoagulants: Secondary | ICD-10-CM | POA: Diagnosis not present

## 2018-12-09 DIAGNOSIS — I639 Cerebral infarction, unspecified: Secondary | ICD-10-CM

## 2019-01-02 DIAGNOSIS — Z0001 Encounter for general adult medical examination with abnormal findings: Secondary | ICD-10-CM | POA: Diagnosis not present

## 2019-01-02 DIAGNOSIS — I10 Essential (primary) hypertension: Secondary | ICD-10-CM | POA: Diagnosis not present

## 2019-01-02 DIAGNOSIS — I482 Chronic atrial fibrillation, unspecified: Secondary | ICD-10-CM | POA: Diagnosis not present

## 2019-01-02 DIAGNOSIS — R42 Dizziness and giddiness: Secondary | ICD-10-CM | POA: Diagnosis not present

## 2019-01-02 DIAGNOSIS — Z0000 Encounter for general adult medical examination without abnormal findings: Secondary | ICD-10-CM | POA: Diagnosis not present

## 2019-01-02 DIAGNOSIS — Z23 Encounter for immunization: Secondary | ICD-10-CM | POA: Diagnosis not present

## 2019-01-02 DIAGNOSIS — R001 Bradycardia, unspecified: Secondary | ICD-10-CM | POA: Diagnosis not present

## 2019-01-02 DIAGNOSIS — E782 Mixed hyperlipidemia: Secondary | ICD-10-CM | POA: Diagnosis not present

## 2019-01-02 LAB — PROTIME-INR: INR: 3.2 — AB (ref <=1.1)

## 2019-01-12 ENCOUNTER — Ambulatory Visit (INDEPENDENT_AMBULATORY_CARE_PROVIDER_SITE_OTHER): Payer: Medicare Other | Admitting: Pharmacist

## 2019-01-12 DIAGNOSIS — I4891 Unspecified atrial fibrillation: Secondary | ICD-10-CM

## 2019-01-12 DIAGNOSIS — Z7901 Long term (current) use of anticoagulants: Secondary | ICD-10-CM

## 2019-01-31 ENCOUNTER — Other Ambulatory Visit: Payer: Self-pay | Admitting: Cardiovascular Disease

## 2019-02-02 ENCOUNTER — Telehealth: Payer: Self-pay | Admitting: Cardiovascular Disease

## 2019-02-02 NOTE — Telephone Encounter (Signed)
Labs scanned- will call patient once given results per MD.

## 2019-02-02 NOTE — Telephone Encounter (Signed)
Patient's PCP, Dr. Nevada Crane,  was supposed to fax over results from recent blood work. The patient usually has his blood work done by Dr. Claiborne Billings, but due to COVID he saw Dr. Nevada Crane before Dr. Claiborne Billings

## 2019-02-02 NOTE — Telephone Encounter (Signed)
Labs from PCP scanned to epic.   Sent to MD to review

## 2019-02-09 NOTE — Telephone Encounter (Signed)
Patient called w/results °

## 2019-02-09 NOTE — Telephone Encounter (Signed)
Labs stable; INR minimally increased at 3.2. HbA1c 5.9; lipids, renal fxn, HB stable

## 2019-02-13 ENCOUNTER — Encounter: Payer: Self-pay | Admitting: Cardiovascular Disease

## 2019-02-13 ENCOUNTER — Ambulatory Visit (INDEPENDENT_AMBULATORY_CARE_PROVIDER_SITE_OTHER): Payer: Medicare Other | Admitting: Cardiovascular Disease

## 2019-02-13 ENCOUNTER — Other Ambulatory Visit: Payer: Self-pay

## 2019-02-13 ENCOUNTER — Ambulatory Visit (INDEPENDENT_AMBULATORY_CARE_PROVIDER_SITE_OTHER): Payer: Medicare Other | Admitting: Pharmacist Clinician (PhC)/ Clinical Pharmacy Specialist

## 2019-02-13 VITALS — BP 129/75 | HR 81 | Temp 97.2°F | Ht 76.0 in | Wt 320.9 lb

## 2019-02-13 DIAGNOSIS — I639 Cerebral infarction, unspecified: Secondary | ICD-10-CM

## 2019-02-13 DIAGNOSIS — R600 Localized edema: Secondary | ICD-10-CM

## 2019-02-13 DIAGNOSIS — I4891 Unspecified atrial fibrillation: Secondary | ICD-10-CM

## 2019-02-13 DIAGNOSIS — Z7901 Long term (current) use of anticoagulants: Secondary | ICD-10-CM | POA: Diagnosis not present

## 2019-02-13 DIAGNOSIS — E785 Hyperlipidemia, unspecified: Secondary | ICD-10-CM | POA: Diagnosis not present

## 2019-02-13 DIAGNOSIS — I482 Chronic atrial fibrillation, unspecified: Secondary | ICD-10-CM

## 2019-02-13 LAB — POCT INR: INR: 2.3 (ref 2.0–3.0)

## 2019-02-13 MED ORDER — SIMVASTATIN 40 MG PO TABS: 40.0000 mg | ORAL_TABLET | Freq: Every day | ORAL | 6 refills | Status: DC

## 2019-02-13 MED ORDER — ATENOLOL 100 MG PO TABS: 200.0000 mg | ORAL_TABLET | Freq: Every day | ORAL | 6 refills | Status: DC

## 2019-02-13 NOTE — Progress Notes (Signed)
Patient ID: ABDULLA POOLEY, male   DOB: 03-09-1956, 63 y.o.   MRN: [id number]     HPI: DAMARION MENDIZABAL is a 63 y.o. male who presents for a 70 month cardiology evaluation  Mr. Dalin Caldera has a history of permanent atrial fibrillation and has been on chronic Coumadin therapy.  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  He has a long-standing history of tobacco use and currently is still smoking 1 pack per day. He has a history of hyperlipidemia, and moderate obesity.  A sleep study in December 2011 revealed mild increased upper airways resistance syndrome with mild sleep apnea, only during REM sleep and he is not on CPAP therapy.  He has been on disability since his stroke and after his wife's death.  He is a former Administrator and retired in 2009.  Over the past year, Mr. Altadonna denies any change in symptoms.   He walks a aproximately  3000 steps  For 3-4 days per week.  He is unaware of any chest pressure.  He denies shortness of breath.  He is unaware of palpitations.  He is regular in getting his Coumadin checked.    He underwent lipid studies in May 2017 and on simvastatin 40 mg LDL was 70, total cholesterol 125, HDL 45, triglycerides 50.   He has been disabled since 2009 as result of his stroke.  I last saw him in May 2019 at which time he had some mild residual left and leg weakness.   He continued to be on warfarin for anticoagulation.  He has not had interest in [place] therapy due to increased cost.  He was on atenolol 200 mg daily, digoxin 0.125 mg for rate control of his permanent atrial fibrillation.  Peripheral neuropathy was treated with gabapentin 300 mg daily.  He was undergoing monthly INR checks.  He denied bleeding.  He denied any chest pain, PND orthopnea but at times had noticed some mild lower extremity edema.  Since I last saw him, he has continued to be fairly stable.  He specifically denies chest pain.  There is no dizziness.  His atrial  fibrillation rate has been well controlled.  He does note intermittent right leg swelling greater than left.  Recently had undergone laboratory by Dr. Wende Neighbors on January 02, 2019.  At that time INR was 3.2.  Digoxin level less than 0.4.  Total cholesterol was 110, triglycerides 61, HDL 39, and LDL 59.  Hemoglobin A1c was 5.9.  He had stable hemoglobin hematocrit.  He presents for evaluation.  Past Medical History:  Diagnosis Date  . CVA (cerebral vascular accident) ([place])    Remote, small  . Hyperlipidemia   . Hypersomnia with sleep apnea    Sleep Study (07/16/2010) AHI-2.3/hr, AHI during REM-9.4/hr, RDI-5.3/hr, REM during REM-12.8/hr, AVG O2 during REM and NREM was 93.0%  . Permanent atrial fibrillation     Past Surgical History:  Procedure Laterality Date  . CARDIOVASCULAR STRESS TEST  06/18/2008   Perfusion defect in the inferior and apical myocardial region consistent with diaphragmatic attenuation. Remaining myocardium presents normal myocardial perfusion with no evidence of ischemia or infarct. EKG negative for ischemia.  . 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 in configuration or arterial feeders or venous drainage  . TRANSTHORACIC ECHOCARDIOGRAM  08/01/2010   EF >55%, LA mild-moderately dilated,     No Known Allergies  Current Outpatient Medications  Medication Sig Dispense Refill  . acetaminophen (TYLENOL) 500 MG tablet Take 500 mg by mouth as needed for headache.     Marland Kitchen aspirin EC 81 MG tablet Take 81 mg by mouth daily.    Marland Kitchen atenolol (TENORMIN) 100 MG tablet Take 2 tablets (200 mg total) by mouth daily. 60 tablet 6  . gabapentin (NEURONTIN) 300 MG capsule Take 300 mg by mouth daily.    . potassium gluconate 595 MG TABS tablet Take 595 mg by mouth 2 (two) times daily.     . simvastatin (ZOCOR) 40 MG tablet Take 1 tablet (40 mg total) by mouth daily. 30 tablet 6  . warfarin (COUMADIN) 4 MG tablet TAKE 1 TABLET BY MOUTH  EVERY DAY OR AS DIRECTED BY COUMADIN CLINIC 90 tablet 1   No current facility-administered medications for this visit.     Social History   Socioeconomic History  . Marital status: Widowed    Spouse name: Not on file  . Number of children: Not on file  . Years of education: Not on file  . Highest education level: Not on file  Occupational History  . Not on file  Social Needs  . Financial resource strain: Not on file  . Food insecurity    Worry: Not on file    Inability: Not on file  . Transportation needs    Medical: Not on file    Non-medical: Not on file  Tobacco Use  . Smoking status: Current Every Day Smoker    Packs/day: 1.00    Years: 29.00    Pack years: 29.00    Types: Cigarettes  . Smokeless tobacco: Never Used  Substance and Sexual Activity  . Alcohol use: Yes    Comment: 2 beers per month  . Drug use: Not on file  . Sexual activity: Not on file  Lifestyle  . Physical activity    Days per week: Not on file    Minutes per session: Not on file  . Stress: Not on file  Relationships  . Social Herbalist on phone: Not on file    Gets together: Not on file    Attends religious service: Not on file    Active member of club or organization: Not on file    Attends meetings of clubs or organizations: Not on file    Relationship status: Not on file  . Intimate partner violence    Fear of current or ex partner: Not on file    Emotionally abused: Not on file    Physically abused: Not on file    Forced sexual activity: Not on file  Other Topics Concern  . Not on file  Social History Narrative  . Not on file   Socially, he is widowed.  He has no children.  Does smoke tobacco one pack per day.  He is a former Administrator.  Family History  Problem Relation Age of Onset  . Cancer Mother        Lung cancer  . Cancer Father        Stomach cancer  . Cancer Brother     ROS General: Negative; No fevers, chills, or night sweats;  Positive for obesity  HEENT: Negative; No changes in vision or hearing, sinus congestion, difficulty swallowing Pulmonary: Negative; No cough, wheezing, shortness of breath, hemoptysis Cardiovascular:  See HPI Intermittent right leg swelling GI: Negative; No nausea, vomiting, diarrhea, or abdominal pain GU: Negative; No dysuria, hematuria, or difficulty voiding Musculoskeletal:  Negative; no myalgias, joint pain, or weakness Hematologic/Oncology: Negative; no easy bruising, bleeding Endocrine: Negative; no heat/cold intolerance; no diabetes Neuro: stroke in 2009 Skin: Negative; No rashes or skin lesions Psychiatric: Negative; No behavioral problems, depression Sleep: Negative; No snoring, daytime sleepiness, hypersomnolence, bruxism, restless legs, hypnogognic hallucinations, no cataplexy Other comprehensive 14 point system review is negative.  PE BP 129/75   Pulse 81   Temp (!) 97.2 F (36.2 C)   Ht 6' 4"  (1.93 m)   Wt (!) 320 lb 14.4 oz (145.6 kg)   SpO2 98%   BMI 39.06 kg/m    Repeat blood pressure by me was 130/72  Wt Readings from Last 3 Encounters:  02/13/19 (!) 320 lb 14.4 oz (145.6 kg)  12/02/17 (!) 322 lb 3.2 oz (146.1 kg)  11/25/16 (!) 338 lb 6.4 oz (153.5 kg)    General: Alert, oriented, no distress. obese Skin: normal turgor, no rashes, warm and dry HEENT: Normocephalic, atraumatic. Pupils equal round and reactive to light; sclera anicteric; extraocular muscles intact;  Nose without nasal septal hypertrophy Mouth/Parynx benign;  Neck: No JVD, no carotid bruits; normal carotid upstroke Lungs: clear to ausculatation and percussion; no wheezing or rales Chest wall: without tenderness to palpitation Heart: PMI not displaced, RRR, s1 s2 normal, 1/6 systolic murmur, no diastolic murmur, no rubs, gallops, thrills, or heaves Abdomen: soft, nontender; no hepatosplenomehaly, BS+; abdominal aorta nontender and not dilated by palpation. Back: no CVA tenderness Pulses 2+ Musculoskeletal: full  range of motion, normal strength, no joint deformities Extremities: Trace left ankle; 1+ right ankle pretibial swelling.  No clubbing cyanosis or edema, Homan's sign negative  Neurologic: grossly nonfocal; Cranial nerves grossly wnl Psychologic: Normal mood and affect   ECG (independently read by me): AF at 57; RBBB, LAHB  May 2019 ECG (independently read by me): Atrial fibrillation at 71 bpm.  LVH by voltage.  Left axis deviation.  Normal intervals.  April 2018 ECG (independently read by me): Atrial fibrillation with a ventricular rate at 67.  Nonspecific interventricular conduction delay, late.  April 2016 ECG (independently read by me):  Atrial fibrillation with a controlled ventricular response at 59 bpm.  Normal intervals.   April 2015 ECG (independently read by me): Atrial fibrillation at 67 beats per minute.  Possible LVH by voltage criteria in aVL.  No significant ST-T change.  LABS:  BMP Latest Ref Rng & Units 12/02/2017 12/03/2016 12/04/2015  Glucose 65 - 99 mg/dL 91 97 89  BUN 8 - 27 mg/dL 11 13 16   Creatinine 0.76 - 1.27 mg/dL 0.90 0.86 0.90  BUN/Creat Ratio 10 - 24 12 - -  Sodium 134 - 144 mmol/L 141 141 142  Potassium 3.5 - 5.2 mmol/L 5.2 4.6 4.9  Chloride 96 - 106 mmol/L 101 106 104  CO2 20 - 29 mmol/L 25 28 29   Calcium 8.6 - 10.2 mg/dL 9.6 8.8 9.3    Hepatic Function Latest Ref Rng & Units 12/02/2017 12/03/2016 12/04/2015  Total Protein 6.0 - 8.5 g/dL 7.5 6.8 6.9  Albumin 3.6 - 4.8 g/dL 4.4 3.8 4.1  AST 0 - 40 IU/L 16 17 15   ALT 0 - 44 IU/L 14 14 13   Alk Phosphatase 39 - 117 IU/L 97 71 81  Total Bilirubin 0.0 - 1.2 mg/dL 0.5 0.6 0.6    CBC Latest Ref Rng & Units 12/02/2017 12/03/2016 12/04/2015  WBC 3.4 - 10.8 x10E3/uL 6.8 6.2 6.8  Hemoglobin 13.0 - 17.7 g/dL 16.5 14.3 16.0  Hematocrit 37.5 - 51.0 %  47.9 43.1 48.3  Platelets 150 - 379 x10E3/uL 191 202 209   Lab Results  Component Value Date   TSH 1.800 12/02/2017    BNP No results found for: PROBNP  Lipid Panel      Component Value Date/Time   CHOL 128 12/02/2017 1104   TRIG 57 12/02/2017 1104   HDL 42 12/02/2017 1104   CHOLHDL 3.0 12/02/2017 1104   CHOLHDL 2.7 12/03/2016 1020   VLDL 14 12/03/2016 1020   LDLCALC 75 12/02/2017 1104   INR today 2.2  RADIOLOGY: No results found.  IMPRESSION:  1. Chronic atrial fibrillation   2. Long term current use of anticoagulant therapy   3. Hyperlipidemia LDL goal <70   4. Morbid obesity ([place])   5. Lower extremity edema   6. Cerebrovascular accident (CVA), 2009     ASSESSMENT AND PLAN: Mr. Abbas Beyene is a 63 year old gentleman with a long-standing history of permanent atrial fibrillation for which he is on  chronic Coumadin therapy,  He suffered a small CVA in June 2009,  Since that time, he is retired and has been on disability.  His ECG today continues to show atrial fibrillation with excellent rate control.  He continue to be on atenolol 200 mg daily, digoxin 0.125 mg for rate control.  Since his recent laboratory has shown a very low digoxin level, I have suggested that we most likely can discontinue this therapy.  He will reduce the dose to 0.625 mg for 1 week and then discontinue.  His rate should still be well maintained with his current atenolol regimen.  His blood pressure today is stable.  We will check an INR today since he was unable to get this checked on July 3 and adjustments will be made if necessary to his warfarin regimen.  He has lower extremity edema and has not been routinely taking HCTZ.  I have suggested reinstitution of this as long as edema is present.  He continues to be on gabapentin for his peripheral neuropathy.  His most recent lipid panel is excellent on his current dose of simvastatin 40 mg daily.  We discussed the importance of complete smoking cessation.  He has reduced his smoking to less than 1/2 pack/day.  We also discussed the importance of weight loss and increased exercise.  I reviewed his laboratory.  I will see him in 1 year  for reevaluation  Time spent: 25 minutes  Troy Sine, MD, [facility]  02/14/2019 12:46 PM

## 2019-02-13 NOTE — Patient Instructions (Signed)
Medication Instructions:  Stop Digoxin  If you need a refill on your cardiac medications before your next appointment, please call your pharmacy.   Follow-Up: At [facility], you and your health needs are our priority.  As part of our continuing mission to provide you with exceptional heart care, we have created designated Provider Care Teams.  These Care Teams include your primary Cardiologist (physician) and Advanced Practice Providers (APPs -  Physician Assistants and Nurse Practitioners) who all work together to provide you with the care you need, when you need it. You will need a follow up appointment in 12 months.  Please call our office 2 months in advance to schedule this appointment.  You may see Dr.Kelly or one of the following Advanced Practice Providers on your designated Care Team: Almyra Deforest, [place] . Fabian Sharp, PA-C

## 2019-02-14 ENCOUNTER — Encounter: Payer: Self-pay | Admitting: Cardiovascular Disease

## 2019-03-06 ENCOUNTER — Other Ambulatory Visit: Payer: Self-pay | Admitting: Cardiovascular Disease

## 2019-03-06 DIAGNOSIS — Z7901 Long term (current) use of anticoagulants: Secondary | ICD-10-CM | POA: Diagnosis not present

## 2019-03-06 LAB — PROTIME-INR
INR: 2 — ABNORMAL HIGH
Prothrombin Time: 20.2 s — ABNORMAL HIGH (ref 9.0–11.5)

## 2019-03-07 ENCOUNTER — Ambulatory Visit (INDEPENDENT_AMBULATORY_CARE_PROVIDER_SITE_OTHER): Payer: Medicare Other | Admitting: Pharmacist Clinician (PhC)/ Clinical Pharmacy Specialist

## 2019-03-07 DIAGNOSIS — Z7901 Long term (current) use of anticoagulants: Secondary | ICD-10-CM

## 2019-03-07 DIAGNOSIS — I4891 Unspecified atrial fibrillation: Secondary | ICD-10-CM | POA: Diagnosis not present

## 2019-04-05 ENCOUNTER — Other Ambulatory Visit: Payer: Self-pay | Admitting: Cardiovascular Disease

## 2019-04-05 DIAGNOSIS — Z0001 Encounter for general adult medical examination with abnormal findings: Secondary | ICD-10-CM | POA: Diagnosis not present

## 2019-04-05 DIAGNOSIS — L723 Sebaceous cyst: Secondary | ICD-10-CM | POA: Diagnosis not present

## 2019-04-05 DIAGNOSIS — Z23 Encounter for immunization: Secondary | ICD-10-CM | POA: Diagnosis not present

## 2019-04-05 DIAGNOSIS — Z7901 Long term (current) use of anticoagulants: Secondary | ICD-10-CM | POA: Diagnosis not present

## 2019-04-05 LAB — PROTIME-INR
INR: 2 — ABNORMAL HIGH
Prothrombin Time: 19.6 s — ABNORMAL HIGH (ref 9.0–11.5)

## 2019-04-07 ENCOUNTER — Ambulatory Visit: Payer: Self-pay | Admitting: Pharmacist Clinician (PhC)/ Clinical Pharmacy Specialist

## 2019-04-07 DIAGNOSIS — I4891 Unspecified atrial fibrillation: Secondary | ICD-10-CM

## 2019-04-07 DIAGNOSIS — Z7901 Long term (current) use of anticoagulants: Secondary | ICD-10-CM

## 2019-04-11 DIAGNOSIS — I10 Essential (primary) hypertension: Secondary | ICD-10-CM | POA: Diagnosis not present

## 2019-04-11 DIAGNOSIS — E782 Mixed hyperlipidemia: Secondary | ICD-10-CM | POA: Diagnosis not present

## 2019-04-11 DIAGNOSIS — G9009 Other idiopathic peripheral autonomic neuropathy: Secondary | ICD-10-CM | POA: Diagnosis not present

## 2019-04-11 DIAGNOSIS — I482 Chronic atrial fibrillation, unspecified: Secondary | ICD-10-CM | POA: Diagnosis not present

## 2019-05-05 ENCOUNTER — Other Ambulatory Visit: Payer: Self-pay | Admitting: Cardiovascular Disease

## 2019-05-05 DIAGNOSIS — Z7901 Long term (current) use of anticoagulants: Secondary | ICD-10-CM | POA: Diagnosis not present

## 2019-05-05 DIAGNOSIS — G9009 Other idiopathic peripheral autonomic neuropathy: Secondary | ICD-10-CM | POA: Diagnosis not present

## 2019-05-05 DIAGNOSIS — I482 Chronic atrial fibrillation, unspecified: Secondary | ICD-10-CM | POA: Diagnosis not present

## 2019-05-05 DIAGNOSIS — I10 Essential (primary) hypertension: Secondary | ICD-10-CM | POA: Diagnosis not present

## 2019-05-05 DIAGNOSIS — E782 Mixed hyperlipidemia: Secondary | ICD-10-CM | POA: Diagnosis not present

## 2019-05-05 LAB — PROTIME-INR
INR: 2 — ABNORMAL HIGH
Prothrombin Time: 19.5 s — ABNORMAL HIGH (ref 9.0–11.5)

## 2019-05-11 ENCOUNTER — Telehealth: Payer: Self-pay | Admitting: Cardiovascular Disease

## 2019-05-11 NOTE — Telephone Encounter (Signed)
No message needed °

## 2019-05-29 ENCOUNTER — Telehealth: Payer: Self-pay | Admitting: Cardiovascular Disease

## 2019-05-29 NOTE — Telephone Encounter (Signed)
°  Cory Petty, Pharmacist at Dr. Juel Burrow office was calling to see if Dr. Claiborne Billings was OK with switching the patient from Coumadin to Eliquis. The patient qualified for a program where he can het the medication for $8/ mo.   The number provided is Lindsey's direct number. Please contact her with Dr. Evette Georges response

## 2019-05-31 ENCOUNTER — Telehealth: Payer: Self-pay

## 2019-05-31 NOTE — Telephone Encounter (Signed)
Okay to switch from warfarin to Eliquis

## 2019-05-31 NOTE — Telephone Encounter (Signed)
Called and spoke w/pt regarding overdue inr and the pt stated that they are supposed to be switching to eliquis. I told the pt that they need another inr check prior to switching to the eliquis and the pt stated that they will plan to go [facility] to get them completed so that we can switch him. The pt was instructed to call back once the lab work is complete so that we can be on the lookout for those results.

## 2019-06-01 MED ORDER — APIXABAN 5 MG PO TABS: 5.0000 mg | ORAL_TABLET | Freq: Two times a day (BID) | ORAL | 6 refills | Status: DC

## 2019-06-01 NOTE — Telephone Encounter (Signed)
D/w Dr Claiborne Billings ok to start Eliquis 5mg  BID RX SENT [facility] AS REQUESTED

## 2019-06-01 NOTE — Telephone Encounter (Signed)
Sherril Croon, Pharmacist at Dr. Juel Burrow office, notified her of Dr Evette Georges message she states that she will start him on Eliquis and stop the Warfarin and test until INR is <2 then start on Eliquis. She would like to have Dr Claiborne Billings to determine the dosing and send a new rx for this. Will forward to Dr Claiborne Billings for this dosing then [facility].

## 2019-06-05 DIAGNOSIS — I10 Essential (primary) hypertension: Secondary | ICD-10-CM | POA: Diagnosis not present

## 2019-06-05 DIAGNOSIS — E782 Mixed hyperlipidemia: Secondary | ICD-10-CM | POA: Diagnosis not present

## 2019-06-05 DIAGNOSIS — I482 Chronic atrial fibrillation, unspecified: Secondary | ICD-10-CM | POA: Diagnosis not present

## 2019-06-06 ENCOUNTER — Other Ambulatory Visit: Payer: Self-pay | Admitting: Cardiovascular Disease

## 2019-06-06 DIAGNOSIS — Z7901 Long term (current) use of anticoagulants: Secondary | ICD-10-CM | POA: Diagnosis not present

## 2019-06-06 LAB — PROTIME-INR
INR: 2.2 — ABNORMAL HIGH
Prothrombin Time: 22.1 s — ABNORMAL HIGH (ref 9.0–11.5)

## 2019-06-06 LAB — POCT INR: INR: 2.2 (ref 2.0–3.0)

## 2019-06-08 ENCOUNTER — Ambulatory Visit (INDEPENDENT_AMBULATORY_CARE_PROVIDER_SITE_OTHER): Payer: Medicare Other | Admitting: Cardiology

## 2019-06-08 ENCOUNTER — Telehealth: Payer: Self-pay | Admitting: Cardiovascular Disease

## 2019-06-08 DIAGNOSIS — Z7901 Long term (current) use of anticoagulants: Secondary | ICD-10-CM | POA: Diagnosis not present

## 2019-06-08 DIAGNOSIS — I4891 Unspecified atrial fibrillation: Secondary | ICD-10-CM

## 2019-06-08 NOTE — Telephone Encounter (Signed)
Patient calling for INR results

## 2019-08-25 DIAGNOSIS — E782 Mixed hyperlipidemia: Secondary | ICD-10-CM | POA: Diagnosis not present

## 2019-08-25 DIAGNOSIS — I10 Essential (primary) hypertension: Secondary | ICD-10-CM | POA: Diagnosis not present

## 2019-08-25 DIAGNOSIS — I482 Chronic atrial fibrillation, unspecified: Secondary | ICD-10-CM | POA: Diagnosis not present

## 2019-10-05 DIAGNOSIS — Z8673 Personal history of transient ischemic attack (TIA), and cerebral infarction without residual deficits: Secondary | ICD-10-CM | POA: Diagnosis not present

## 2019-10-05 DIAGNOSIS — G9009 Other idiopathic peripheral autonomic neuropathy: Secondary | ICD-10-CM | POA: Diagnosis not present

## 2019-10-05 DIAGNOSIS — I10 Essential (primary) hypertension: Secondary | ICD-10-CM | POA: Diagnosis not present

## 2019-10-05 DIAGNOSIS — E782 Mixed hyperlipidemia: Secondary | ICD-10-CM | POA: Diagnosis not present

## 2019-10-05 DIAGNOSIS — I482 Chronic atrial fibrillation, unspecified: Secondary | ICD-10-CM | POA: Diagnosis not present

## 2019-11-23 DIAGNOSIS — E782 Mixed hyperlipidemia: Secondary | ICD-10-CM | POA: Diagnosis not present

## 2019-11-23 DIAGNOSIS — I10 Essential (primary) hypertension: Secondary | ICD-10-CM | POA: Diagnosis not present

## 2019-11-23 DIAGNOSIS — I482 Chronic atrial fibrillation, unspecified: Secondary | ICD-10-CM | POA: Diagnosis not present

## 2019-11-23 DIAGNOSIS — G9009 Other idiopathic peripheral autonomic neuropathy: Secondary | ICD-10-CM | POA: Diagnosis not present

## 2020-01-15 DIAGNOSIS — I482 Chronic atrial fibrillation, unspecified: Secondary | ICD-10-CM | POA: Diagnosis not present

## 2020-01-15 DIAGNOSIS — G9009 Other idiopathic peripheral autonomic neuropathy: Secondary | ICD-10-CM | POA: Diagnosis not present

## 2020-01-15 DIAGNOSIS — E782 Mixed hyperlipidemia: Secondary | ICD-10-CM | POA: Diagnosis not present

## 2020-01-18 DIAGNOSIS — E782 Mixed hyperlipidemia: Secondary | ICD-10-CM | POA: Diagnosis not present

## 2020-01-18 DIAGNOSIS — I482 Chronic atrial fibrillation, unspecified: Secondary | ICD-10-CM | POA: Diagnosis not present

## 2020-01-18 DIAGNOSIS — Z23 Encounter for immunization: Secondary | ICD-10-CM | POA: Diagnosis not present

## 2020-01-18 DIAGNOSIS — Z0001 Encounter for general adult medical examination with abnormal findings: Secondary | ICD-10-CM | POA: Diagnosis not present

## 2020-01-18 DIAGNOSIS — I10 Essential (primary) hypertension: Secondary | ICD-10-CM | POA: Diagnosis not present

## 2020-01-22 DIAGNOSIS — I482 Chronic atrial fibrillation, unspecified: Secondary | ICD-10-CM | POA: Diagnosis not present

## 2020-01-22 DIAGNOSIS — Z720 Tobacco use: Secondary | ICD-10-CM | POA: Diagnosis not present

## 2020-01-22 DIAGNOSIS — I10 Essential (primary) hypertension: Secondary | ICD-10-CM | POA: Diagnosis not present

## 2020-01-22 DIAGNOSIS — E785 Hyperlipidemia, unspecified: Secondary | ICD-10-CM | POA: Diagnosis not present

## 2020-02-12 ENCOUNTER — Encounter: Payer: Self-pay | Admitting: Cardiovascular Disease

## 2020-02-12 ENCOUNTER — Other Ambulatory Visit: Payer: Self-pay

## 2020-02-12 ENCOUNTER — Ambulatory Visit: Payer: Medicare Other | Admitting: Cardiovascular Disease

## 2020-02-12 VITALS — BP 120/71 | HR 61 | Temp 95.7°F | Ht 76.0 in | Wt 348.4 lb

## 2020-02-12 DIAGNOSIS — R600 Localized edema: Secondary | ICD-10-CM | POA: Diagnosis not present

## 2020-02-12 DIAGNOSIS — Z7901 Long term (current) use of anticoagulants: Secondary | ICD-10-CM | POA: Diagnosis not present

## 2020-02-12 DIAGNOSIS — E785 Hyperlipidemia, unspecified: Secondary | ICD-10-CM

## 2020-02-12 DIAGNOSIS — Z720 Tobacco use: Secondary | ICD-10-CM

## 2020-02-12 DIAGNOSIS — I482 Chronic atrial fibrillation, unspecified: Secondary | ICD-10-CM

## 2020-02-12 MED ORDER — FUROSEMIDE 20 MG PO TABS: 20.0000 mg | ORAL_TABLET | Freq: Every day | ORAL | 2 refills | Status: DC

## 2020-02-12 NOTE — Patient Instructions (Signed)
Medication Instructions:  BEGIN TAKING FUROSEMIDE 20MG  DAILY (1 TAB DAILY) IF YOUR SWELLING GOES AWAY WE CAN REDUCE THIS. *If you need a refill on your cardiac medications before your next appointment, please call your pharmacy*   Lab Work: 2-3 WEEKS- BMET If you have labs (blood work) drawn today and your tests are completely normal, you will receive your results only by: Marland Kitchen MyChart Message (if you have MyChart) OR . A paper copy in the mail If you have any lab test that is abnormal or we need to change your treatment, we will call you to review the results.    Follow-Up: At [facility], you and your health needs are our priority.  As part of our continuing mission to provide you with exceptional heart care, we have created designated Provider Care Teams.  These Care Teams include your primary Cardiologist (physician) and Advanced Practice Providers (APPs -  Physician Assistants and Nurse Practitioners) who all work together to provide you with the care you need, when you need it.  We recommend signing up for the patient portal called "MyChart".  Sign up information is provided on this After Visit Summary.  MyChart is used to connect with patients for Virtual Visits (Telemedicine).  Patients are able to view lab/test results, encounter notes, upcoming appointments, etc.  Non-urgent messages can be sent to your provider as well.   To learn more about what you can do with MyChart, go to [email or web address].    Your next appointment:   12 month(s)  The format for your next appointment:   In Person  Provider:   DR. Shelva Majestic

## 2020-02-12 NOTE — Progress Notes (Signed)
Patient ID: Cory Petty, male   DOB: [birth year], 64 y.o.   MRN: [id number]     HPI: Cory Petty is a 64 y.o. male who presents for a 23 month cardiology evaluation  Cory Petty has a history of permanent atrial fibrillation and has been on chronic Coumadin therapy.  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  He has a long-standing history of tobacco use and currently is still smoking 1 pack per day. He has a history of hyperlipidemia, and moderate obesity.  A sleep study in December 2011 revealed mild increased upper airways resistance syndrome with mild sleep apnea, only during REM sleep and he is not on CPAP therapy.  He has been on disability since his stroke and after his wife's death.  He is a former Administrator and retired in 2009.  Over the past year, Cory Petty denies any change in symptoms.   He walks a aproximately  3000 steps  For 3-4 days per week.  He is unaware of any chest pressure.  He denies shortness of breath.  He is unaware of palpitations.  He is regular in getting his Coumadin checked.    He underwent lipid studies in May 2017 and on simvastatin 40 mg LDL was 70, total cholesterol 125, HDL 45, triglycerides 50.   He has been disabled since 2009 as result of his stroke.  I last saw him in May 2019 at which time he had some mild residual left and leg weakness.   He continued to be on warfarin for anticoagulation.  He has not had interest in [place] therapy due to increased cost.  He was on atenolol 200 mg daily, digoxin 0.125 mg for rate control of his permanent atrial fibrillation.  Peripheral neuropathy was treated with gabapentin 300 mg daily.  He was undergoing monthly INR checks.  He denied bleeding.  He denied any chest pain, PND orthopnea but at times had noticed some mild lower extremity edema.  I last saw him in July 2020 which time he denied any chest pain or dizziness.   His atrial fibrillation rate has been well controlled.  He  does note intermittent right leg swelling greater than left.  Recently had undergone laboratory by Dr. Wende Neighbors on January 02, 2019.  At that time INR was 3.2.  Digoxin level less than 0.4.  Total cholesterol was 110, triglycerides 61, HDL 39, and LDL 59.  Hemoglobin A1c was 5.9.  He had stable hemoglobin hematocrit.   Since I last saw him, he admits to weight gain particularly during this Covid pandemic.  In July 2020 weight was 320 his weight is increased at 348.  He does note occasional leg swelling.  He continues to smoke and typically a carton of cigarettes will last 1 month.  He denies any chest pain PND orthopnea.  He sees Dr. Nevada Crane for his primary care who checks laboratory.  He presents for yearly evaluation.  Past Medical History:  Diagnosis Date  . CVA (cerebral vascular accident) ([place])    Remote, small  . Hyperlipidemia   . Hypersomnia with sleep apnea    Sleep Study (07/16/2010) AHI-2.3/hr, AHI during REM-9.4/hr, RDI-5.3/hr, REM during REM-12.8/hr, AVG O2 during REM and NREM was 93.0%  . Permanent atrial fibrillation [facility])     Past Surgical History:  Procedure Laterality Date  . CARDIOVASCULAR STRESS TEST  06/18/2008   Perfusion defect in the inferior and apical myocardial  region consistent with diaphragmatic attenuation. Remaining myocardium presents normal myocardial perfusion with no evidence of ischemia or infarct. EKG negative for ischemia.  . 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 in configuration or arterial feeders or venous drainage  . TRANSTHORACIC ECHOCARDIOGRAM  08/01/2010   EF >55%, LA mild-moderately dilated,     No Known Allergies  Current Outpatient Medications  Medication Sig Dispense Refill  . acetaminophen (TYLENOL) 500 MG tablet Take 500 mg by mouth as needed for headache.     Marland Kitchen apixaban (ELIQUIS) 5 MG TABS tablet Take 1 tablet (5 mg total) by mouth 2 (two) times daily. 60 tablet 6  . atenolol  (TENORMIN) 100 MG tablet Take 2 tablets (200 mg total) by mouth daily. 60 tablet 6  . gabapentin (NEURONTIN) 300 MG capsule Take 300 mg by mouth daily.    . potassium gluconate 595 MG TABS tablet Take 595 mg by mouth 2 (two) times daily.     . simvastatin (ZOCOR) 40 MG tablet Take 1 tablet (40 mg total) by mouth daily. 30 tablet 6  . aspirin EC 81 MG tablet Take 81 mg by mouth daily.     No current facility-administered medications for this visit.    Social History   Socioeconomic History  . Marital status: Widowed    Spouse name: Not on file  . Number of children: Not on file  . Years of education: Not on file  . Highest education level: Not on file  Occupational History  . Not on file  Tobacco Use  . Smoking status: Current Every Day Smoker    Packs/day: 1.00    Years: 29.00    Pack years: 29.00    Types: Cigarettes  . Smokeless tobacco: Never Used  Substance and Sexual Activity  . Alcohol use: Yes    Comment: 2 beers per month  . Drug use: Not on file  . Sexual activity: Not on file  Other Topics Concern  . Not on file  Social History Narrative  . Not on file   Social Determinants of Health   Financial Resource Strain:   . Difficulty of Paying Living Expenses:   Food Insecurity:   . Worried About Charity fundraiser in the Last Year:   . Arboriculturist in the Last Year:   Transportation Needs:   . Film/video editor (Medical):   Marland Kitchen Lack of Transportation (Non-Medical):   Physical Activity:   . Days of Exercise per Week:   . Minutes of Exercise per Session:   Stress:   . Feeling of Stress :   Social Connections:   . Frequency of Communication with Friends and Family:   . Frequency of Social Gatherings with Friends and Family:   . Attends Religious Services:   . Active Member of Clubs or Organizations:   . Attends Archivist Meetings:   Marland Kitchen Marital Status:   Intimate Partner Violence:   . Fear of Current or Ex-Partner:   . Emotionally Abused:     Marland Kitchen Physically Abused:   . Sexually Abused:    Socially, he is widowed.  He has no children.  Does smoke tobacco one pack per day.  He is a former Administrator.  Family History  Problem Relation Age of Onset  . Cancer Mother        Lung cancer  . Cancer Father        Stomach cancer  . Cancer Brother  ROS General: Negative; No fevers, chills, or night sweats;  Positive for obesity HEENT: Negative; No changes in vision or hearing, sinus congestion, difficulty swallowing Pulmonary: Negative; No cough, wheezing, shortness of breath, hemoptysis Cardiovascular:  See HPI Intermittent right leg swelling GI: Negative; No nausea, vomiting, diarrhea, or abdominal pain GU: Negative; No dysuria, hematuria, or difficulty voiding Musculoskeletal: Negative; no myalgias, joint pain, or weakness Hematologic/Oncology: Negative; no easy bruising, bleeding Endocrine: Negative; no heat/cold intolerance; no diabetes Neuro: stroke in 2009 Skin: Negative; No rashes or skin lesions Psychiatric: Negative; No behavioral problems, depression Sleep: Negative; No snoring, daytime sleepiness, hypersomnolence, bruxism, restless legs, hypnogognic hallucinations, no cataplexy Other comprehensive 14 point system review is negative.  PE BP 120/71   Pulse 61   Temp (!) 95.7 F (35.4 C)   Ht 6' 4"  (1.93 m)   Wt (!) 348 lb 6.4 oz (158 kg)   SpO2 96%   BMI 42.41 kg/m    Repeat blood pressure by me was 122/70  Wt Readings from Last 3 Encounters:  02/12/20 (!) 348 lb 6.4 oz (158 kg)  02/13/19 (!) 320 lb 14.4 oz (145.6 kg)  12/02/17 (!) 322 lb 3.2 oz (146.1 kg)   General: Alert, oriented, no distress.  Morbidly obese with BMI 42.41 Skin: normal turgor, no rashes, warm and dry HEENT: Normocephalic, atraumatic. Pupils equal round and reactive to light; sclera anicteric; extraocular muscles intact;  Nose without nasal septal hypertrophy Mouth/Parynx benign; Mallinpatti scale 3 Neck: No JVD, no carotid  bruits; normal carotid upstroke Lungs: clear to ausculatation and percussion; no wheezing or rales Chest wall: without tenderness to palpitation Heart: PMI not displaced, RRR, s1 s2 normal, 1/6 systolic murmur, no diastolic murmur, no rubs, gallops, thrills, or heaves Abdomen: soft, nontender; no hepatosplenomehaly, BS+; abdominal aorta nontender and not dilated by palpation. Back: no CVA tenderness Pulses 2+ Musculoskeletal: full range of motion, normal strength, no joint deformities Extremities: trace -1+ pitting ankle edema, no clubbing cyanosis or edema, Homan's sign negative  Neurologic: grossly nonfocal; Cranial nerves grossly wnl Psychologic: Normal mood and affect   ECG (independently read by me): Atrial fibrillation at 61; RBBB with repolarization changes  July 2020 ECG (independently read by me): AF at 57; RBBB, LAHB  May 2019 ECG (independently read by me): Atrial fibrillation at 71 bpm.  LVH by voltage.  Left axis deviation.  Normal intervals.  April 2018 ECG (independently read by me): Atrial fibrillation with a ventricular rate at 67.  Nonspecific interventricular conduction delay, late.  April 2016 ECG (independently read by me):  Atrial fibrillation with a controlled ventricular response at 59 bpm.  Normal intervals.   April 2015 ECG (independently read by me): Atrial fibrillation at 67 beats per minute.  Possible LVH by voltage criteria in aVL.  No significant ST-T change.  LABS:  BMP Latest Ref Rng & Units 12/02/2017 12/03/2016 12/04/2015  Glucose 65 - 99 mg/dL 91 97 89  BUN 8 - 27 mg/dL 11 13 16   Creatinine 0.76 - 1.27 mg/dL 0.90 0.86 0.90  BUN/Creat Ratio 10 - 24 12 - -  Sodium 134 - 144 mmol/L 141 141 142  Potassium 3.5 - 5.2 mmol/L 5.2 4.6 4.9  Chloride 96 - 106 mmol/L 101 106 104  CO2 20 - 29 mmol/L 25 28 29   Calcium 8.6 - 10.2 mg/dL 9.6 8.8 9.3    Hepatic Function Latest Ref Rng & Units 12/02/2017 12/03/2016 12/04/2015  Total Protein 6.0 - 8.5 g/dL 7.5 6.8 6.9    Albumin  3.6 - 4.8 g/dL 4.4 3.8 4.1  AST 0 - 40 IU/L 16 17 15   ALT 0 - 44 IU/L 14 14 13   Alk Phosphatase 39 - 117 IU/L 97 71 81  Total Bilirubin 0.0 - 1.2 mg/dL 0.5 0.6 0.6    CBC Latest Ref Rng & Units 12/02/2017 12/03/2016 12/04/2015  WBC 3.4 - 10.8 x10E3/uL 6.8 6.2 6.8  Hemoglobin 13.0 - 17.7 g/dL 16.5 14.3 16.0  Hematocrit 37.5 - 51.0 % 47.9 43.1 48.3  Platelets 150 - 379 x10E3/uL 191 202 209   Lab Results  Component Value Date   TSH 1.800 12/02/2017    BNP No results found for: PROBNP  Lipid Panel     Component Value Date/Time   CHOL 128 12/02/2017 1104   TRIG 57 12/02/2017 1104   HDL 42 12/02/2017 1104   CHOLHDL 3.0 12/02/2017 1104   CHOLHDL 2.7 12/03/2016 1020   VLDL 14 12/03/2016 1020   LDLCALC 75 12/02/2017 1104   INR today 2.2  RADIOLOGY: No results found.  IMPRESSION:  No diagnosis found.  ASSESSMENT AND PLAN: Cory Petty is a 64 year old gentleman with a long-standing history of permanent atrial fibrillation for which he was initially on warfarin for anticoagulation therapy and most recently is on apixaban.  He suffered a small CVA in June 2009,  Since that time, he is retired and has been on disability.  His blood pressure today is stable and his atrial fibrillation rate is controlled with atenolol 200 mg daily.  There is ankle swelling and I have recommended furosemide 20 mg daily initially and then possibly switch to as needed depending upon edema.  I discussed the importance of sodium restriction.  I will recheck a BMET in follow-up of furosemide initiation.  We discussed the importance of weight loss.  He has not been exercising.  He has gained 28 pounds over the past year.  He is now morbidly obese with a BMI of 42.4.  We discussed the importance of smoking cessation.  Currently he is smoking a carton per month.  He sees Dr. Edwyna Ready call for his primary care who checks laboratory.  He is on Neurontin for peripheral neuropathy.  He continues to be on simvastatin 40  mg daily for hyperlipidemia.  Last laboratory in June 2020 showed an excellent LDL at 59.  I will see him in 1 year for reevaluation or sooner as needed.   Cory Sine, MD, [facility]  02/12/2020 9:22 AM

## 2020-02-18 ENCOUNTER — Encounter: Payer: Self-pay | Admitting: Cardiovascular Disease

## 2020-02-19 DIAGNOSIS — E785 Hyperlipidemia, unspecified: Secondary | ICD-10-CM | POA: Diagnosis not present

## 2020-02-19 DIAGNOSIS — I482 Chronic atrial fibrillation, unspecified: Secondary | ICD-10-CM | POA: Diagnosis not present

## 2020-02-19 DIAGNOSIS — I10 Essential (primary) hypertension: Secondary | ICD-10-CM | POA: Diagnosis not present

## 2020-02-19 DIAGNOSIS — Z720 Tobacco use: Secondary | ICD-10-CM | POA: Diagnosis not present

## 2020-02-27 ENCOUNTER — Other Ambulatory Visit: Payer: Self-pay | Admitting: Cardiovascular Disease

## 2020-02-27 NOTE — Telephone Encounter (Signed)
Spoke with pt in regards to medication refill and I called [facility] to get a better understand and now patient will receive medication 03/02/20

## 2020-02-27 NOTE — Telephone Encounter (Signed)
*  STAT* If patient is at the pharmacy, call can be transferred to refill team.   1. Which medications need to be refilled? (please list name of each medication and dose if known) Furosemide  2. Which pharmacy/location (including street and city if local pharmacy) is medication to be sent to? [facility]  3. Do they need a 30 day or 90 day supply?  30 days

## 2020-03-01 DIAGNOSIS — Z720 Tobacco use: Secondary | ICD-10-CM | POA: Diagnosis not present

## 2020-03-01 DIAGNOSIS — Z23 Encounter for immunization: Secondary | ICD-10-CM | POA: Diagnosis not present

## 2020-03-01 DIAGNOSIS — E785 Hyperlipidemia, unspecified: Secondary | ICD-10-CM | POA: Diagnosis not present

## 2020-03-01 DIAGNOSIS — L723 Sebaceous cyst: Secondary | ICD-10-CM | POA: Diagnosis not present

## 2020-04-04 DIAGNOSIS — E782 Mixed hyperlipidemia: Secondary | ICD-10-CM | POA: Diagnosis not present

## 2020-04-04 DIAGNOSIS — I10 Essential (primary) hypertension: Secondary | ICD-10-CM | POA: Diagnosis not present

## 2020-04-04 DIAGNOSIS — G9009 Other idiopathic peripheral autonomic neuropathy: Secondary | ICD-10-CM | POA: Diagnosis not present

## 2020-04-04 DIAGNOSIS — I482 Chronic atrial fibrillation, unspecified: Secondary | ICD-10-CM | POA: Diagnosis not present

## 2020-04-12 DIAGNOSIS — R600 Localized edema: Secondary | ICD-10-CM | POA: Diagnosis not present

## 2020-04-12 DIAGNOSIS — W57XXXA Bitten or stung by nonvenomous insect and other nonvenomous arthropods, initial encounter: Secondary | ICD-10-CM | POA: Diagnosis not present

## 2020-05-17 ENCOUNTER — Other Ambulatory Visit: Payer: Self-pay | Admitting: Cardiovascular Disease

## 2020-05-21 DIAGNOSIS — I482 Chronic atrial fibrillation, unspecified: Secondary | ICD-10-CM | POA: Diagnosis not present

## 2020-05-21 DIAGNOSIS — I10 Essential (primary) hypertension: Secondary | ICD-10-CM | POA: Diagnosis not present

## 2020-05-21 DIAGNOSIS — G9009 Other idiopathic peripheral autonomic neuropathy: Secondary | ICD-10-CM | POA: Diagnosis not present

## 2020-05-21 DIAGNOSIS — E782 Mixed hyperlipidemia: Secondary | ICD-10-CM | POA: Diagnosis not present

## 2020-06-18 DIAGNOSIS — E782 Mixed hyperlipidemia: Secondary | ICD-10-CM | POA: Diagnosis not present

## 2020-06-18 DIAGNOSIS — I482 Chronic atrial fibrillation, unspecified: Secondary | ICD-10-CM | POA: Diagnosis not present

## 2020-06-18 DIAGNOSIS — G9009 Other idiopathic peripheral autonomic neuropathy: Secondary | ICD-10-CM | POA: Diagnosis not present

## 2020-06-18 DIAGNOSIS — I10 Essential (primary) hypertension: Secondary | ICD-10-CM | POA: Diagnosis not present

## 2020-07-18 DIAGNOSIS — Z23 Encounter for immunization: Secondary | ICD-10-CM | POA: Diagnosis not present

## 2020-07-18 DIAGNOSIS — E785 Hyperlipidemia, unspecified: Secondary | ICD-10-CM | POA: Diagnosis not present

## 2020-07-18 DIAGNOSIS — Z131 Encounter for screening for diabetes mellitus: Secondary | ICD-10-CM | POA: Diagnosis not present

## 2020-07-18 DIAGNOSIS — R42 Dizziness and giddiness: Secondary | ICD-10-CM | POA: Diagnosis not present

## 2020-07-18 DIAGNOSIS — L723 Sebaceous cyst: Secondary | ICD-10-CM | POA: Diagnosis not present

## 2020-07-18 DIAGNOSIS — R601 Generalized edema: Secondary | ICD-10-CM | POA: Diagnosis not present

## 2020-07-18 DIAGNOSIS — Z720 Tobacco use: Secondary | ICD-10-CM | POA: Diagnosis not present

## 2020-07-23 DIAGNOSIS — G9009 Other idiopathic peripheral autonomic neuropathy: Secondary | ICD-10-CM | POA: Diagnosis not present

## 2020-07-23 DIAGNOSIS — F17210 Nicotine dependence, cigarettes, uncomplicated: Secondary | ICD-10-CM | POA: Diagnosis not present

## 2020-07-23 DIAGNOSIS — E782 Mixed hyperlipidemia: Secondary | ICD-10-CM | POA: Diagnosis not present

## 2020-07-23 DIAGNOSIS — I482 Chronic atrial fibrillation, unspecified: Secondary | ICD-10-CM | POA: Diagnosis not present

## 2020-07-23 DIAGNOSIS — I10 Essential (primary) hypertension: Secondary | ICD-10-CM | POA: Diagnosis not present

## 2020-08-26 DIAGNOSIS — Z23 Encounter for immunization: Secondary | ICD-10-CM | POA: Diagnosis not present

## 2020-08-26 DIAGNOSIS — E785 Hyperlipidemia, unspecified: Secondary | ICD-10-CM | POA: Diagnosis not present

## 2020-08-26 DIAGNOSIS — L723 Sebaceous cyst: Secondary | ICD-10-CM | POA: Diagnosis not present

## 2020-08-26 DIAGNOSIS — Z720 Tobacco use: Secondary | ICD-10-CM | POA: Diagnosis not present

## 2020-08-31 DIAGNOSIS — I482 Chronic atrial fibrillation, unspecified: Secondary | ICD-10-CM | POA: Diagnosis not present

## 2020-08-31 DIAGNOSIS — Z8673 Personal history of transient ischemic attack (TIA), and cerebral infarction without residual deficits: Secondary | ICD-10-CM | POA: Diagnosis not present

## 2020-08-31 DIAGNOSIS — E782 Mixed hyperlipidemia: Secondary | ICD-10-CM | POA: Diagnosis not present

## 2020-08-31 DIAGNOSIS — F17210 Nicotine dependence, cigarettes, uncomplicated: Secondary | ICD-10-CM | POA: Diagnosis not present

## 2020-08-31 DIAGNOSIS — M25512 Pain in left shoulder: Secondary | ICD-10-CM | POA: Diagnosis not present

## 2020-08-31 DIAGNOSIS — K056 Periodontal disease, unspecified: Secondary | ICD-10-CM | POA: Diagnosis not present

## 2020-08-31 DIAGNOSIS — R601 Generalized edema: Secondary | ICD-10-CM | POA: Diagnosis not present

## 2020-08-31 DIAGNOSIS — I10 Essential (primary) hypertension: Secondary | ICD-10-CM | POA: Diagnosis not present

## 2020-09-30 DIAGNOSIS — M25512 Pain in left shoulder: Secondary | ICD-10-CM | POA: Diagnosis not present

## 2020-09-30 DIAGNOSIS — I10 Essential (primary) hypertension: Secondary | ICD-10-CM | POA: Diagnosis not present

## 2020-09-30 DIAGNOSIS — Z8673 Personal history of transient ischemic attack (TIA), and cerebral infarction without residual deficits: Secondary | ICD-10-CM | POA: Diagnosis not present

## 2020-09-30 DIAGNOSIS — R601 Generalized edema: Secondary | ICD-10-CM | POA: Diagnosis not present

## 2020-09-30 DIAGNOSIS — K056 Periodontal disease, unspecified: Secondary | ICD-10-CM | POA: Diagnosis not present

## 2020-09-30 DIAGNOSIS — F17210 Nicotine dependence, cigarettes, uncomplicated: Secondary | ICD-10-CM | POA: Diagnosis not present

## 2020-09-30 DIAGNOSIS — E782 Mixed hyperlipidemia: Secondary | ICD-10-CM | POA: Diagnosis not present

## 2020-09-30 DIAGNOSIS — I482 Chronic atrial fibrillation, unspecified: Secondary | ICD-10-CM | POA: Diagnosis not present

## 2020-10-30 DIAGNOSIS — E782 Mixed hyperlipidemia: Secondary | ICD-10-CM | POA: Diagnosis not present

## 2020-10-30 DIAGNOSIS — I10 Essential (primary) hypertension: Secondary | ICD-10-CM | POA: Diagnosis not present

## 2021-01-23 DIAGNOSIS — I10 Essential (primary) hypertension: Secondary | ICD-10-CM | POA: Insufficient documentation

## 2021-01-24 DIAGNOSIS — I10 Essential (primary) hypertension: Secondary | ICD-10-CM | POA: Diagnosis not present

## 2021-01-24 DIAGNOSIS — R7301 Impaired fasting glucose: Secondary | ICD-10-CM | POA: Diagnosis not present

## 2021-01-24 DIAGNOSIS — E785 Hyperlipidemia, unspecified: Secondary | ICD-10-CM | POA: Diagnosis not present

## 2021-01-28 DIAGNOSIS — I482 Chronic atrial fibrillation, unspecified: Secondary | ICD-10-CM | POA: Diagnosis not present

## 2021-01-28 DIAGNOSIS — R600 Localized edema: Secondary | ICD-10-CM | POA: Insufficient documentation

## 2021-01-28 DIAGNOSIS — R7301 Impaired fasting glucose: Secondary | ICD-10-CM | POA: Diagnosis not present

## 2021-01-28 DIAGNOSIS — K056 Periodontal disease, unspecified: Secondary | ICD-10-CM | POA: Insufficient documentation

## 2021-01-28 DIAGNOSIS — F17201 Nicotine dependence, unspecified, in remission: Secondary | ICD-10-CM | POA: Diagnosis not present

## 2021-01-28 DIAGNOSIS — E875 Hyperkalemia: Secondary | ICD-10-CM | POA: Insufficient documentation

## 2021-01-28 DIAGNOSIS — G609 Hereditary and idiopathic neuropathy, unspecified: Secondary | ICD-10-CM | POA: Insufficient documentation

## 2021-01-28 DIAGNOSIS — Z0001 Encounter for general adult medical examination with abnormal findings: Secondary | ICD-10-CM | POA: Diagnosis not present

## 2021-01-28 DIAGNOSIS — Z23 Encounter for immunization: Secondary | ICD-10-CM | POA: Diagnosis not present

## 2021-01-28 DIAGNOSIS — K054 Periodontosis: Secondary | ICD-10-CM | POA: Diagnosis not present

## 2021-02-17 ENCOUNTER — Other Ambulatory Visit: Payer: Self-pay

## 2021-02-17 ENCOUNTER — Encounter: Payer: Self-pay | Admitting: Cardiovascular Disease

## 2021-02-17 ENCOUNTER — Ambulatory Visit: Payer: Medicare Other | Admitting: Cardiovascular Disease

## 2021-02-17 VITALS — BP 130/70 | HR 59 | Ht 76.0 in | Wt 349.2 lb

## 2021-02-17 DIAGNOSIS — Z7901 Long term (current) use of anticoagulants: Secondary | ICD-10-CM | POA: Diagnosis not present

## 2021-02-17 DIAGNOSIS — I482 Chronic atrial fibrillation, unspecified: Secondary | ICD-10-CM | POA: Diagnosis not present

## 2021-02-17 DIAGNOSIS — I452 Bifascicular block: Secondary | ICD-10-CM | POA: Diagnosis not present

## 2021-02-17 DIAGNOSIS — Z720 Tobacco use: Secondary | ICD-10-CM | POA: Diagnosis not present

## 2021-02-17 DIAGNOSIS — R600 Localized edema: Secondary | ICD-10-CM

## 2021-02-17 NOTE — Progress Notes (Signed)
Patient ID: Cory Petty, male   DOB: October 08, 1955, 65 y.o.   MRN: [id number]     HPI: Cory Petty is a 65 y.o. male who presents for a 77 month cardiology evaluation  Cory Petty has a history of permanent atrial fibrillation and has been on chronic Coumadin therapy.  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  He has a long-standing history of tobacco use and currently is still smoking 1 pack per day. He has a history of hyperlipidemia, and moderate obesity.  A sleep study in December 2011 revealed mild increased upper airways resistance syndrome with mild sleep apnea, only during REM sleep and he is not on CPAP therapy.  He has been on disability since his stroke and after his wife's death.  He is a former Administrator and retired in 2009.  Over the past year, Cory Petty denies any change in symptoms.   He walks a aproximately  3000 steps  For 3-4 days per week.  He is unaware of any chest pressure.  He denies shortness of breath.  He is unaware of palpitations.  He is regular in getting his Coumadin checked.    He underwent lipid studies in May 2017 and on simvastatin 40 mg LDL was 70, total cholesterol 125, HDL 45, triglycerides 50.   He has been disabled since 2009 as result of his stroke.  I last saw him in May 2019 at which time he had some mild residual left and leg weakness.   He continued to be on warfarin for anticoagulation.  He has not had interest in [place] therapy due to increased cost.  He was on atenolol 200 mg daily, digoxin 0.125 mg for rate control of his permanent atrial fibrillation.  Peripheral neuropathy was treated with gabapentin 300 mg daily.  He was undergoing monthly INR checks.  He denied bleeding.  He denied any chest pain, PND orthopnea but at times had noticed some mild lower extremity edema.  I saw him in July 2020 which time he denied any chest pain or dizziness.   His atrial fibrillation rate has been well controlled.  He does  note intermittent right leg swelling greater than left.  Recently had undergone laboratory by Dr. Wende Neighbors on January 02, 2019.  At that time INR was 3.2.  Digoxin level less than 0.4.  Total cholesterol was 110, triglycerides 61, HDL 39, and LDL 59.  Hemoglobin A1c was 5.9.  He had stable hemoglobin hematocrit.   I last saw him in June July 2021.  During the [place] pandemic he admitted to significant weight gain.  His weight in 2020 was 320 pounds which increased at 348.  He noted some moderate amount of leg swelling.  He denied any chest pain, PND orthopnea.  He has been seeing Dr. Wende Neighbors for his primary care who has been checking laboratory.    Over the past year, Cory Petty has continued to be active working in his yard.  He denies any chest pain or palpitations.  He has permanent atrial fibrillation and continues to be on Eliquis 5 mg twice a day.  He continues to be on atenolol 200 mg daily for rate control.  Over the past year his furosemide dose has been increased by Dr. Wende Neighbors and 3 weeks ago he was increased up to 60 mg daily.  He brought with him laboratory from January 24, 2021 which I reviewed with  him in detail.  BUN was 21 and creatinine 1.1.  Lipid studies were excellent with an LDL of 71 on his current simvastatin 40 mg dose.  Hematocrit were stable at 14.3 and 49.9.  Hemoglobin A1c was 5.8.  He presents for yearly evaluation.   Past Medical History:  Diagnosis Date   CVA (cerebral vascular accident) ([place])    Remote, small   Hyperlipidemia    Hypersomnia with sleep apnea    Sleep Study (07/16/2010) AHI-2.3/hr, AHI during REM-9.4/hr, RDI-5.3/hr, REM during REM-12.8/hr, AVG O2 during REM and NREM was 93.0%   Permanent atrial fibrillation [facility])     Past Surgical History:  Procedure Laterality Date   CARDIOVASCULAR STRESS TEST  06/18/2008   Perfusion defect in the inferior and apical myocardial region consistent with diaphragmatic attenuation. Remaining myocardium presents normal  myocardial perfusion with no evidence of ischemia or infarct. EKG negative for ischemia.   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 in configuration or arterial feeders or venous drainage   TRANSTHORACIC ECHOCARDIOGRAM  08/01/2010   EF >55%, LA mild-moderately dilated,     No Known Allergies  Current Outpatient Medications  Medication Sig Dispense Refill   acetaminophen (TYLENOL) 500 MG tablet Take 500 mg by mouth as needed for headache.      apixaban (ELIQUIS) 5 MG TABS tablet Take 1 tablet (5 mg total) by mouth 2 (two) times daily. 60 tablet 6   atenolol (TENORMIN) 100 MG tablet Take 2 tablets (200 mg total) by mouth daily. 60 tablet 6   buPROPion (WELLBUTRIN XL) 150 MG 24 hr tablet Take 150 mg by mouth every morning.     furosemide (LASIX) 20 MG tablet TAKE ONE TABLET BY MOUTH ONCE DAILY 30 tablet 11   gabapentin (NEURONTIN) 300 MG capsule Take 300 mg by mouth daily.     potassium gluconate 595 MG TABS tablet Take 595 mg by mouth 2 (two) times daily.      simvastatin (ZOCOR) 40 MG tablet Take 1 tablet (40 mg total) by mouth daily. 30 tablet 6   No current facility-administered medications for this visit.    Social History   Socioeconomic History   Marital status: Widowed    Spouse name: Not on file   Number of children: Not on file   Years of education: Not on file   Highest education level: Not on file  Occupational History   Not on file  Tobacco Use   Smoking status: Every Day    Packs/day: 1.00    Years: 29.00    Pack years: 29.00    Types: Cigarettes   Smokeless tobacco: Never  Substance and Sexual Activity   Alcohol use: Yes    Comment: 2 beers per month   Drug use: Not on file   Sexual activity: Not on file  Other Topics Concern   Not on file  Social History Narrative   Not on file   Social Determinants of Health   Financial Resource Strain: Not on file  Food Insecurity: Not on file  Transportation  Needs: Not on file  Physical Activity: Not on file  Stress: Not on file  Social Connections: Not on file  Intimate Partner Violence: Not on file   Socially, he is widowed.  He has no children.  Does smoke tobacco one pack per day.  He is a former Administrator.  Family History  Problem Relation Age of Onset   Cancer Mother  Lung cancer   Cancer Father        Stomach cancer   Cancer Brother     ROS General: Negative; No fevers, chills, or night sweats;  Positive for obesity HEENT: Negative; No changes in vision or hearing, sinus congestion, difficulty swallowing Pulmonary: Negative; No cough, wheezing, shortness of breath, hemoptysis Cardiovascular:  See HPI Bilateral lower extremity edema, now wearing support stockings GI: Negative; No nausea, vomiting, diarrhea, or abdominal pain GU: Negative; No dysuria, hematuria, or difficulty voiding Musculoskeletal: Negative; no myalgias, joint pain, or weakness Hematologic/Oncology: Negative; no easy bruising, bleeding Endocrine: Negative; no heat/cold intolerance; no diabetes Neuro: stroke in 2009 Skin: Negative; No rashes or skin lesions Psychiatric: Negative; No behavioral problems, depression Sleep: Negative; No snoring, daytime sleepiness, hypersomnolence, bruxism, restless legs, hypnogognic hallucinations, no cataplexy Other comprehensive 14 point system review is negative.  PE BP 130/70 (BP Location: Left Arm)   Pulse (!) 59   Ht 6' 4"  (1.93 m)   Wt (!) 349 lb 3.2 oz (158.4 kg)   BMI 42.51 kg/m    Repeat blood pressure by me was 124/72.  Wt Readings from Last 3 Encounters:  02/17/21 (!) 349 lb 3.2 oz (158.4 kg)  02/12/20 (!) 348 lb 6.4 oz (158 kg)  02/13/19 (!) 320 lb 14.4 oz (145.6 kg)   General: Alert, oriented, no distress.  Skin: normal turgor, no rashes, warm and dry HEENT: Normocephalic, atraumatic. Pupils equal round and reactive to light; sclera anicteric; extraocular muscles intact;  Nose without nasal  septal hypertrophy Mouth/Parynx benign; Mallinpatti scale 3 Neck: No JVD, no carotid bruits; normal carotid upstroke Lungs: clear to ausculatation and percussion; no wheezing or rales Chest wall: without tenderness to palpitation Heart: PMI not displaced, RRR, s1 s2 normal, 1/6 systolic murmur, no diastolic murmur, no rubs, gallops, thrills, or heaves Abdomen: soft, nontender; no hepatosplenomehaly, BS+; abdominal aorta nontender and not dilated by palpation. Back: no CVA tenderness Pulses 2+ Musculoskeletal: full range of motion, normal strength, no joint deformities Extremities: Wearing support stockings.  Residual trace to 1+ edema below the support stockings, edema has improved with his increased Lasix dose to 60 mg daily no clubbing cyanosis,  Homan's sign negative  Neurologic: grossly nonfocal; Cranial nerves grossly wnl Psychologic: Normal mood and affect  ECG (independently read by me): Atrial fibrillation with ventricular rate at 59 bpm, right bundle branch block, left anterior hemiblock consistent with bifascicular block.  QTc interval 451 ms  July 2021 ECG (independently read by me): Atrial fibrillation at 61; RBBB with repolarization changes  July 2020 ECG (independently read by me): AF at 57; RBBB, LAHB  May 2019 ECG (independently read by me): Atrial fibrillation at 71 bpm.  LVH by voltage.  Left axis deviation.  Normal intervals.  April 2018 ECG (independently read by me): Atrial fibrillation with a ventricular rate at 67.  Nonspecific interventricular conduction delay, late.  April 2016 ECG (independently read by me):  Atrial fibrillation with a controlled ventricular response at 59 bpm.  Normal intervals.   April 2015 ECG (independently read by me): Atrial fibrillation at 67 beats per minute.  Possible LVH by voltage criteria in aVL.  No significant ST-T change.  LABS:  BMP Latest Ref Rng & Units 12/02/2017 12/03/2016 12/04/2015  Glucose 65 - 99 mg/dL 91 97 89  BUN 8 - 27  mg/dL 11 13 16   Creatinine 0.76 - 1.27 mg/dL 0.90 0.86 0.90  BUN/Creat Ratio 10 - 24 12 - -  Sodium 134 - 144 mmol/L  141 141 142  Potassium 3.5 - 5.2 mmol/L 5.2 4.6 4.9  Chloride 96 - 106 mmol/L 101 106 104  CO2 20 - 29 mmol/L 25 28 29   Calcium 8.6 - 10.2 mg/dL 9.6 8.8 9.3    Hepatic Function Latest Ref Rng & Units 12/02/2017 12/03/2016 12/04/2015  Total Protein 6.0 - 8.5 g/dL 7.5 6.8 6.9  Albumin 3.6 - 4.8 g/dL 4.4 3.8 4.1  AST 0 - 40 IU/L 16 17 15   ALT 0 - 44 IU/L 14 14 13   Alk Phosphatase 39 - 117 IU/L 97 71 81  Total Bilirubin 0.0 - 1.2 mg/dL 0.5 0.6 0.6    CBC Latest Ref Rng & Units 12/02/2017 12/03/2016 12/04/2015  WBC 3.4 - 10.8 x10E3/uL 6.8 6.2 6.8  Hemoglobin 13.0 - 17.7 g/dL 16.5 14.3 16.0  Hematocrit 37.5 - 51.0 % 47.9 43.1 48.3  Platelets 150 - 379 x10E3/uL 191 202 209   Lab Results  Component Value Date   TSH 1.800 12/02/2017    BNP No results found for: PROBNP  Lipid Panel     Component Value Date/Time   CHOL 128 12/02/2017 1104   TRIG 57 12/02/2017 1104   HDL 42 12/02/2017 1104   CHOLHDL 3.0 12/02/2017 1104   CHOLHDL 2.7 12/03/2016 1020   VLDL 14 12/03/2016 1020   LDLCALC 75 12/02/2017 1104   INR today 2.2  RADIOLOGY: No results found.  IMPRESSION:  1. Chronic atrial fibrillation ([place])   2. Long term current use of anticoagulant therapy   3. Lower extremity edema   4. Bifascicular block: RBBB, LAHB   5. Morbid obesity ([place])   6. Tobacco abuse: quit March 2022     ASSESSMENT AND PLAN: Mr. Kaynen Minner is a 65 year old gentleman with a long-standing history of permanent atrial fibrillation since 1990 for which he was initially on warfarin for anticoagulation therapy and most recently is on apixaban.  He suffered a small CVA in June 2009,  Since that time, he is retired and has been on disability.  His ECG shows atrial fibrillation with rate control at a rate of 59 bpm.  He has right bundle branch block and left anterior hemiblock.  He continues to be on  Eliquis and is tolerating this well without bleeding.  He recently has had progressive lower extremity edema and his furosemide dose has recently been titrated up to 60 mg daily with improvement.  There is still residual trace to 1+ edema below the support stockings.  I have suggested that he can take an extra 20 mg dose for total of 80 mg needed on days there is more swelling but otherwise continue taking the 60 mg dose.  Lipid studies are excellent with an LDL cholesterol of 71, total cholesterol 127, triglycerides 48 and HDL 45 on his current regimen of simvastatin 40 mg daily.  Quit smoking in March 2022 and continues to be on Wellbutrin XL with benefit.  He takes gabapentin for peripheral neuropathy.  He will continue current regimen.  He will follow-up with Dr. Wende Neighbors who will be rechecking his laboratory.  I will see him in 1 year for reevaluation or sooner as needed.   Troy Sine, MD, [facility]  02/17/2021 11:43 AM

## 2021-02-17 NOTE — Patient Instructions (Addendum)
Medication Instructions:  Your physician recommends that you continue on your current medications as directed. Please refer to the Current Medication list given to you today.  Per Dr. Claiborne Billings on days you have swelling you may take an additional 20mg  of Lasix with your daily 60mg  of Lasix.   *If you need a refill on your cardiac medications before your next appointment, please call your pharmacy*   Lab Work: None ordered.    Testing/Procedures: None ordered.    Follow-Up: At [facility], you and your health needs are our priority.  As part of our continuing mission to provide you with exceptional heart care, we have created designated Provider Care Teams.  These Care Teams include your primary Cardiologist (physician) and Advanced Practice Providers (APPs -  Physician Assistants and Nurse Practitioners) who all work together to provide you with the care you need, when you need it.  We recommend signing up for the patient portal called "MyChart".  Sign up information is provided on this After Visit Summary.  MyChart is used to connect with patients for Virtual Visits (Telemedicine).  Patients are able to view lab/test results, encounter notes, upcoming appointments, etc.  Non-urgent messages can be sent to your provider as well.   To learn more about what you can do with MyChart, go to [email or web address].    Your next appointment:   12 month(s)  The format for your next appointment:   In Person  Provider:   Shelva Majestic, MD

## 2021-07-18 DIAGNOSIS — R7301 Impaired fasting glucose: Secondary | ICD-10-CM | POA: Diagnosis not present

## 2021-07-18 DIAGNOSIS — E782 Mixed hyperlipidemia: Secondary | ICD-10-CM | POA: Diagnosis not present

## 2021-07-24 DIAGNOSIS — R7303 Prediabetes: Secondary | ICD-10-CM | POA: Diagnosis not present

## 2021-07-24 DIAGNOSIS — Z23 Encounter for immunization: Secondary | ICD-10-CM | POA: Diagnosis not present

## 2021-07-24 DIAGNOSIS — M25562 Pain in left knee: Secondary | ICD-10-CM | POA: Insufficient documentation

## 2021-07-24 DIAGNOSIS — I872 Venous insufficiency (chronic) (peripheral): Secondary | ICD-10-CM | POA: Insufficient documentation

## 2021-07-24 DIAGNOSIS — R972 Elevated prostate specific antigen [PSA]: Secondary | ICD-10-CM | POA: Insufficient documentation

## 2021-07-24 DIAGNOSIS — K054 Periodontosis: Secondary | ICD-10-CM | POA: Diagnosis not present

## 2021-07-24 DIAGNOSIS — I482 Chronic atrial fibrillation, unspecified: Secondary | ICD-10-CM | POA: Diagnosis not present

## 2021-07-24 DIAGNOSIS — F17201 Nicotine dependence, unspecified, in remission: Secondary | ICD-10-CM | POA: Diagnosis not present

## 2021-07-24 DIAGNOSIS — I10 Essential (primary) hypertension: Secondary | ICD-10-CM | POA: Diagnosis not present

## 2021-07-24 DIAGNOSIS — E782 Mixed hyperlipidemia: Secondary | ICD-10-CM | POA: Diagnosis not present

## 2021-07-24 DIAGNOSIS — M545 Low back pain, unspecified: Secondary | ICD-10-CM | POA: Insufficient documentation

## 2021-07-24 DIAGNOSIS — R600 Localized edema: Secondary | ICD-10-CM | POA: Diagnosis not present

## 2021-07-24 DIAGNOSIS — E875 Hyperkalemia: Secondary | ICD-10-CM | POA: Diagnosis not present

## 2021-07-24 DIAGNOSIS — Z0001 Encounter for general adult medical examination with abnormal findings: Secondary | ICD-10-CM | POA: Diagnosis not present

## 2021-10-07 DIAGNOSIS — Z0100 Encounter for examination of eyes and vision without abnormal findings: Secondary | ICD-10-CM | POA: Diagnosis not present

## 2021-10-07 DIAGNOSIS — H5200 Hypermetropia, unspecified eye: Secondary | ICD-10-CM | POA: Diagnosis not present

## 2022-01-05 ENCOUNTER — Ambulatory Visit (INDEPENDENT_AMBULATORY_CARE_PROVIDER_SITE_OTHER): Payer: No Typology Code available for payment source | Admitting: Cardiovascular Disease

## 2022-01-05 ENCOUNTER — Encounter: Payer: Self-pay | Admitting: Cardiovascular Disease

## 2022-01-05 VITALS — BP 110/70 | HR 57 | Ht 76.0 in | Wt 357.6 lb

## 2022-01-05 DIAGNOSIS — Z720 Tobacco use: Secondary | ICD-10-CM | POA: Diagnosis not present

## 2022-01-05 DIAGNOSIS — I482 Chronic atrial fibrillation, unspecified: Secondary | ICD-10-CM

## 2022-01-05 DIAGNOSIS — Z7901 Long term (current) use of anticoagulants: Secondary | ICD-10-CM

## 2022-01-05 DIAGNOSIS — I4821 Permanent atrial fibrillation: Secondary | ICD-10-CM

## 2022-01-05 DIAGNOSIS — E785 Hyperlipidemia, unspecified: Secondary | ICD-10-CM

## 2022-01-05 DIAGNOSIS — R600 Localized edema: Secondary | ICD-10-CM

## 2022-01-05 NOTE — Progress Notes (Signed)
Patient ID: Cory Petty, male   DOB: 1955/08/17, 66 y.o.   MRN: [id number]     HPI: Cory Petty is a 66 y.o. male who presents for an 15 month cardiology evaluation  [name] has a history of permanent atrial fibrillation and has been on chronic Coumadin therapy.  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  He has a long-standing history of tobacco use and currently is still smoking 1 pack per day. He has a history of hyperlipidemia, and moderate obesity.  A sleep study in December 2011 revealed mild increased upper airways resistance syndrome with mild sleep apnea, only during REM sleep and he is not on CPAP therapy.  He has been on disability since his stroke and after his wife's death.  He is a former Administrator and retired in 2009.  Over the past year, Cory Petty denies any change in symptoms.   He walks a aproximately  3000 steps  For 3-4 days per week.  He is unaware of any chest pressure.  He denies shortness of breath.  He is unaware of palpitations.  He is regular in getting his Coumadin checked.    He underwent lipid studies in May 2017 and on simvastatin 40 mg LDL was 70, total cholesterol 125, HDL 45, triglycerides 50.   He has been disabled since 2009 as result of his stroke.  I last saw him in May 2019 at which time he had some mild residual left and leg weakness.   He continued to be on warfarin for anticoagulation.  He has not had interest in Cory Petty therapy due to increased cost.  He was on atenolol 200 mg daily, digoxin 0.125 mg for rate control of his permanent atrial fibrillation.  Peripheral neuropathy was treated with gabapentin 300 mg daily.  He was undergoing monthly INR checks.  He denied bleeding.  He denied any chest pain, PND orthopnea but at times had noticed some mild lower extremity edema.  I saw him in July 2020 which time he denied any chest pain or dizziness.   His atrial fibrillation rate has been well controlled.  He does  note intermittent right leg swelling greater than left.  Recently had undergone laboratory by Cory Petty on January 02, 2019.  At that time INR was 3.2.  Digoxin level less than 0.4.  Total cholesterol was 110, triglycerides 61, HDL 39, and LDL 59.  Hemoglobin A1c was 5.9.  He had stable hemoglobin hematocrit.   I saw him in June July 2021.  During the Cory Petty he admitted to significant weight gain.  His weight in 2020 was 320 pounds which increased at 348.  He noted some moderate amount of leg swelling.  He denied any chest pain, PND orthopnea.  He has been seeing Cory Petty for his primary care who has been checking laboratory.    I last saw him on February 17, 2021.  Since his prior evaluation he continued to remain active working in his yard.  He denied any chest pain or palpitations.   He has permanent atrial fibrillation and continues to be on Eliquis 5 mg twice a day.  He continues to be on atenolol 200 mg daily for rate control.  Over the past year his furosemide dose has been increased by Cory Petty and 3 weeks ago he was increased up to 60 mg daily.  He brought with him laboratory from  January 24, 2021 which I reviewed with him in detail.  BUN was 21 and creatinine 1.1.  Lipid studies were excellent with an LDL of 71 on his current simvastatin 40 mg dose.  Hematocrit were stable at 14.3 and 49.9.  Hemoglobin A1c was 5.8.  He quit smoking in March 2022 and was on Wellbutrin XL with benefit.  Since I last saw him, he states that he has felt well.  He admits to weight gain.  He denies chest pain or palpitations, presyncope or syncope.  He has permanent atrial fibrillation and continues to be on Eliquis 5 mg twice a day for anticoagulation.  He has been taking atenolol 20 mg daily.  He takes gabapentin for restless legs.  He continues to be on furosemide 60 mg which has been beneficial for his lower extremity edema.  He is tolerating simvastatin 40 mg.  He presents for follow-up  evaluation.   Past Medical History:  Diagnosis Date   CVA (cerebral vascular accident) ([place])    Remote, small   Hyperlipidemia    Hypersomnia with sleep apnea    Sleep Study (07/16/2010) AHI-2.3/hr, AHI during REM-9.4/hr, RDI-5.3/hr, REM during REM-12.8/hr, AVG O2 during REM and NREM was 93.0%   Permanent atrial fibrillation [facility])     Past Surgical History:  Procedure Laterality Date   CARDIOVASCULAR STRESS TEST  06/18/2008   Perfusion defect in the inferior and apical myocardial region consistent with diaphragmatic attenuation. Remaining myocardium presents normal myocardial perfusion with no evidence of ischemia or infarct. EKG negative for ischemia.   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 in configuration or arterial feeders or venous drainage   TRANSTHORACIC ECHOCARDIOGRAM  08/01/2010   EF >55%, LA mild-moderately dilated,     No Known Allergies  Current Outpatient Medications  Medication Sig Dispense Refill   acetaminophen (TYLENOL) 500 MG tablet Take 500 mg by mouth as needed for headache.      apixaban (ELIQUIS) 5 MG TABS tablet Take 1 tablet (5 mg total) by mouth 2 (two) times daily. 60 tablet 6   atenolol (TENORMIN) 100 MG tablet Take 2 tablets (200 mg total) by mouth daily. 60 tablet 6   buPROPion (WELLBUTRIN XL) 150 MG 24 hr tablet Take 150 mg by mouth every morning.     furosemide (LASIX) 40 MG tablet Take 60 mg by mouth daily. May take an additional 36m daily as needed for swelling.     gabapentin (NEURONTIN) 300 MG capsule Take 300 mg by mouth daily.     Potassium 99 MG TABS Take 99 mg by mouth daily.     simvastatin (ZOCOR) 40 MG tablet Take 1 tablet (40 mg total) by mouth daily. 30 tablet 6   traMADol (ULTRAM) 50 MG tablet Take 50 mg by mouth 2 (two) times daily as needed.     No current facility-administered medications for this visit.    Social History   Socioeconomic History   Marital status:  Widowed    Spouse name: Not on file   Number of children: Not on file   Years of education: Not on file   Highest education level: Not on file  Occupational History   Not on file  Tobacco Use   Smoking status: Every Day    Packs/day: 1.00    Years: 29.00    Total pack years: 29.00    Types: Cigarettes   Smokeless tobacco: Never  Substance and Sexual Activity   Alcohol use: Yes  Comment: 2 beers per month   Drug use: Not on file   Sexual activity: Not on file  Other Topics Concern   Not on file  Social History Narrative   Not on file   Social Determinants of Health   Financial Resource Strain: Not on file  Food Insecurity: Not on file  Transportation Needs: Not on file  Physical Activity: Not on file  Stress: Not on file  Social Connections: Not on file  Intimate Partner Violence: Not on file   Socially, he is widowed.  He has no children.  Does smoke tobacco one pack per day.  He is a former Administrator.  Family History  Problem Relation Age of Onset   Cancer Mother        Lung cancer   Cancer Father        Stomach cancer   Cancer Brother     ROS General: Negative; No fevers, chills, or night sweats;  Positive for obesity HEENT: Negative; No changes in vision or hearing, sinus congestion, difficulty swallowing Pulmonary: Negative; No cough, wheezing, shortness of breath, hemoptysis Cardiovascular:  See HPI Bilateral lower extremity edema, now wearing support stockings GI: Negative; No nausea, vomiting, diarrhea, or abdominal pain GU: Negative; No dysuria, hematuria, or difficulty voiding Musculoskeletal: Negative; no myalgias, joint pain, or weakness Hematologic/Oncology: Negative; no easy bruising, bleeding Endocrine: Negative; no heat/cold intolerance; no diabetes Neuro: stroke in 2009 Skin: Negative; No rashes or skin lesions Psychiatric: Negative; No behavioral problems, depression Sleep: Negative; No snoring, daytime sleepiness, hypersomnolence,  bruxism, restless legs, hypnogognic hallucinations, no cataplexy Other comprehensive 14 point system review is negative.  PE BP 110/70 (BP Location: Left Arm)   Pulse (!) 57   Ht 6' 4"  (1.93 m)   Wt (!) 357 lb 9.6 oz (162.2 kg)   SpO2 94%   BMI 43.53 kg/m    Repeat blood pressure by me was 130/76  Wt Readings from Last 3 Encounters:  01/05/22 (!) 357 lb 9.6 oz (162.2 kg)  02/17/21 (!) 349 lb 3.2 oz (158.4 kg)  02/12/20 (!) 348 lb 6.4 oz (158 kg)   General: Alert, oriented, no distress.  Skin: normal turgor, no rashes, warm and dry HEENT: Normocephalic, atraumatic. Pupils equal round and reactive to light; sclera anicteric; extraocular muscles intact; Fundi ** Nose without nasal septal hypertrophy Mouth/Parynx benign; Mallinpatti scale 3 Neck: No JVD, no carotid bruits; normal carotid upstroke Lungs: clear to ausculatation and percussion; no wheezing or rales Chest wall: without tenderness to palpitation Heart: PMI not displaced, irregular irregular with controlled ventricular rate in the upper 50s, s1 s2 normal, 1/6 systolic murmur, no diastolic murmur, no rubs, gallops, thrills, or heaves Abdomen: soft, nontender; no hepatosplenomehaly, BS+; abdominal aorta nontender and not dilated by palpation. Back: no CVA tenderness Pulses 2+ Musculoskeletal: full range of motion, normal strength, no joint deformities Extremities: Wear support stockings.  Trivial residual edema; no clubbing cyanosis or edema, Homan's sign negative  Neurologic: grossly nonfocal; Cranial nerves grossly wnl Psychologic: Normal mood and affect    January 05, 2022 ECG (independently read by me): Atrial fibrillation at 57; RBBB, LAHB  February 17, 2021 ECG (independently read by me): Atrial fibrillation with ventricular rate at 59 bpm, right bundle branch block, left anterior hemiblock consistent with bifascicular block.  QTc interval 451 ms  July 2021 ECG (independently read by me): Atrial fibrillation at 61; RBBB  with repolarization changes  July 2020 ECG (independently read by me): AF at 57; RBBB, LAHB  May 2019 ECG (independently read by me): Atrial fibrillation at 71 bpm.  LVH by voltage.  Left axis deviation.  Normal intervals.  April 2018 ECG (independently read by me): Atrial fibrillation with a ventricular rate at 67.  Nonspecific interventricular conduction delay, late.  April 2016 ECG (independently read by me):  Atrial fibrillation with a controlled ventricular response at 59 bpm.  Normal intervals.   April 2015 ECG (independently read by me): Atrial fibrillation at 67 beats per minute.  Possible LVH by voltage criteria in aVL.  No significant ST-T change.  LABS:     Latest Ref Rng & Units 12/02/2017   11:04 AM 12/03/2016   10:20 AM 12/04/2015   10:34 AM  BMP  Glucose 65 - 99 mg/dL 91  97  89   BUN 8 - 27 mg/dL 11  13  16    Creatinine 0.76 - 1.27 mg/dL 0.90  0.86  0.90   BUN/Creat Ratio 10 - 24 12     Sodium 134 - 144 mmol/L 141  141  142   Potassium 3.5 - 5.2 mmol/L 5.2  4.6  4.9   Chloride 96 - 106 mmol/L 101  106  104   CO2 20 - 29 mmol/L 25  28  29    Calcium 8.6 - 10.2 mg/dL 9.6  8.8  9.3        Latest Ref Rng & Units 12/02/2017   11:04 AM 12/03/2016   10:20 AM 12/04/2015   10:34 AM  Hepatic Function  Total Protein 6.0 - 8.5 g/dL 7.5  6.8  6.9   Albumin 3.6 - 4.8 g/dL 4.4  3.8  4.1   AST 0 - 40 IU/L 16  17  15    ALT 0 - 44 IU/L 14  14  13    Alk Phosphatase 39 - 117 IU/L 97  71  81   Total Bilirubin 0.0 - 1.2 mg/dL 0.5  0.6  0.6        Latest Ref Rng & Units 12/02/2017   11:04 AM 12/03/2016   10:20 AM 12/04/2015   10:34 AM  CBC  WBC 3.4 - 10.8 x10E3/uL 6.8  6.2  6.8   Hemoglobin 13.0 - 17.7 g/dL 16.5  14.3  16.0   Hematocrit 37.5 - 51.0 % 47.9  43.1  48.3   Platelets 150 - 379 x10E3/uL 191  202  209    Lab Results  Component Value Date   TSH 1.800 12/02/2017    BNP No results found for: "PROBNP"  Lipid Panel     Component Value Date/Time   CHOL 128 12/02/2017 1104    TRIG 57 12/02/2017 1104   HDL 42 12/02/2017 1104   CHOLHDL 3.0 12/02/2017 1104   CHOLHDL 2.7 12/03/2016 1020   VLDL 14 12/03/2016 1020   LDLCALC 75 12/02/2017 1104   INR today 2.2  RADIOLOGY: No results found.  IMPRESSION:  1. Permanent atrial fibrillation ([place])   2. Long term current use of anticoagulant therapy   3. Lower extremity edema   4. Hyperlipidemia LDL goal <70   5. Morbid obesity ([place])   6. Tobacco abuse: quit March 2022     ASSESSMENT AND PLAN: [name] is a 66 year old gentleman with a long-standing history of permanent atrial fibrillation since 1990 for which he was initially on warfarin for anticoagulation therapy and later changed to apixaban.  He suffered a small CVA in June 2009,  Since that time, he is retired and has been on disability.  He continues to be in permanent 8 atrial fibrillation with excellent rate control.  He has chronic right bundle branch block and left ear hemiblock which is stable.  His previous progressive lower extremity edema has stabilized with furosemide 60 mg daily dosing.  If necessary he takes an extra Lasix 20 mg.  His blood pressure today is well controlled.  He has stayed off tobacco since his quit date in March 2022 and is no longer taking bupropion.  He is on simvastatin for hyperlipidemia and tolerating this well.  Blood pressure and heart rate are stable on his high-dose atenolol 20 mg daily.  He will be following up with Cory Petty who will be rechecking his laboratory.  He has had weight gain since his last office visit and BMI today is 43.5 consistent with morbid obesity.  Weight loss and increased exercise was recommended.  I will see him in 1 year for reevaluation or sooner as needed.  Troy Sine, MD, [facility]  01/09/2022 8:36 AM

## 2022-01-05 NOTE — Patient Instructions (Signed)
Medication Instructions:  Your physician recommends that you continue on your current medications as directed. Please refer to the Current Medication list given to you today.  *If you need a refill on your cardiac medications before your next appointment, please call your pharmacy*  Follow-Up: At [facility], you and your health needs are our priority.  As part of our continuing mission to provide you with exceptional heart care, we have created designated Provider Care Teams.  These Care Teams include your primary Cardiologist (physician) and Advanced Practice Providers (APPs -  Physician Assistants and Nurse Practitioners) who all work together to provide you with the care you need, when you need it.  We recommend signing up for the patient portal called "MyChart".  Sign up information is provided on this After Visit Summary.  MyChart is used to connect with patients for Virtual Visits (Telemedicine).  Patients are able to view lab/test results, encounter notes, upcoming appointments, etc.  Non-urgent messages can be sent to your provider as well.   To learn more about what you can do with MyChart, go to [email or web address].    Your next appointment:   12 month(s)  The format for your next appointment:   In Person  Provider:   Dr. Claiborne Billings   Important Information About Sugar

## 2022-01-09 ENCOUNTER — Encounter: Payer: Self-pay | Admitting: Cardiovascular Disease

## 2022-01-19 DIAGNOSIS — R7303 Prediabetes: Secondary | ICD-10-CM | POA: Diagnosis not present

## 2022-01-19 DIAGNOSIS — R972 Elevated prostate specific antigen [PSA]: Secondary | ICD-10-CM | POA: Diagnosis not present

## 2022-01-19 DIAGNOSIS — E782 Mixed hyperlipidemia: Secondary | ICD-10-CM | POA: Diagnosis not present

## 2022-01-27 DIAGNOSIS — I872 Venous insufficiency (chronic) (peripheral): Secondary | ICD-10-CM | POA: Diagnosis not present

## 2022-01-27 DIAGNOSIS — R7303 Prediabetes: Secondary | ICD-10-CM | POA: Diagnosis not present

## 2022-01-27 DIAGNOSIS — M545 Low back pain, unspecified: Secondary | ICD-10-CM | POA: Diagnosis not present

## 2022-01-27 DIAGNOSIS — R600 Localized edema: Secondary | ICD-10-CM | POA: Diagnosis not present

## 2022-01-27 DIAGNOSIS — E782 Mixed hyperlipidemia: Secondary | ICD-10-CM | POA: Diagnosis not present

## 2022-01-27 DIAGNOSIS — G609 Hereditary and idiopathic neuropathy, unspecified: Secondary | ICD-10-CM | POA: Diagnosis not present

## 2022-01-27 DIAGNOSIS — I10 Essential (primary) hypertension: Secondary | ICD-10-CM | POA: Diagnosis not present

## 2022-01-27 DIAGNOSIS — F17201 Nicotine dependence, unspecified, in remission: Secondary | ICD-10-CM | POA: Diagnosis not present

## 2022-01-27 DIAGNOSIS — I482 Chronic atrial fibrillation, unspecified: Secondary | ICD-10-CM | POA: Diagnosis not present

## 2022-01-27 DIAGNOSIS — E875 Hyperkalemia: Secondary | ICD-10-CM | POA: Diagnosis not present

## 2022-01-27 DIAGNOSIS — K054 Periodontosis: Secondary | ICD-10-CM | POA: Diagnosis not present

## 2022-02-06 ENCOUNTER — Telehealth: Payer: Self-pay

## 2022-02-06 ENCOUNTER — Other Ambulatory Visit: Payer: Self-pay

## 2022-02-06 NOTE — Telephone Encounter (Signed)
Received lab results from [facility] (PCP) will scan into chart. Will route to MD to make aware.   Thanks!  Highlights: A1c- 5.9 Cholesterol total- 117 Trigs- 52 HDL- 46 LDL- 59 Glucose- 95 BUN- 18 Creatinine- 1.06 GFR- 77 Potassium- 5.4 Sodium- 140 AST- 20 ALT- 18 WBC- 6.2 RBC- 4.62 Hemoglobin- 15.0

## 2022-02-09 NOTE — Telephone Encounter (Signed)
K 5.4; he is not on supplemental K or med to increase K.  Avoid foods with K;  recheck K in several days.

## 2022-02-10 NOTE — Telephone Encounter (Signed)
Attempted to contact patient to discuss lab work from PCP. Left call back number.

## 2022-02-19 NOTE — Telephone Encounter (Signed)
Called patient back, LVM to call back to discuss further- and advised repeat blood work was needed.  Left call back number.

## 2022-03-03 ENCOUNTER — Other Ambulatory Visit: Payer: Self-pay

## 2022-03-03 DIAGNOSIS — E875 Hyperkalemia: Secondary | ICD-10-CM

## 2022-03-03 NOTE — Telephone Encounter (Signed)
Attempted to contact patient again, unable to reach. LVM advising the need for repeat blood work.   Labs ordered.

## 2022-04-07 DIAGNOSIS — M545 Low back pain, unspecified: Secondary | ICD-10-CM | POA: Diagnosis not present

## 2022-04-07 DIAGNOSIS — I4891 Unspecified atrial fibrillation: Secondary | ICD-10-CM | POA: Diagnosis not present

## 2022-04-07 DIAGNOSIS — Z79891 Long term (current) use of opiate analgesic: Secondary | ICD-10-CM | POA: Diagnosis not present

## 2022-04-07 DIAGNOSIS — Z008 Encounter for other general examination: Secondary | ICD-10-CM | POA: Diagnosis not present

## 2022-04-07 DIAGNOSIS — G609 Hereditary and idiopathic neuropathy, unspecified: Secondary | ICD-10-CM | POA: Diagnosis not present

## 2022-04-07 DIAGNOSIS — R609 Edema, unspecified: Secondary | ICD-10-CM | POA: Diagnosis not present

## 2022-04-07 DIAGNOSIS — D6869 Other thrombophilia: Secondary | ICD-10-CM | POA: Diagnosis not present

## 2022-04-07 DIAGNOSIS — I69352 Hemiplegia and hemiparesis following cerebral infarction affecting left dominant side: Secondary | ICD-10-CM | POA: Diagnosis not present

## 2022-04-07 DIAGNOSIS — N182 Chronic kidney disease, stage 2 (mild): Secondary | ICD-10-CM | POA: Diagnosis not present

## 2022-04-07 DIAGNOSIS — F17211 Nicotine dependence, cigarettes, in remission: Secondary | ICD-10-CM | POA: Diagnosis not present

## 2022-04-07 DIAGNOSIS — Z6841 Body Mass Index (BMI) 40.0 and over, adult: Secondary | ICD-10-CM | POA: Diagnosis not present

## 2022-04-07 DIAGNOSIS — E785 Hyperlipidemia, unspecified: Secondary | ICD-10-CM | POA: Diagnosis not present

## 2022-04-07 DIAGNOSIS — R7303 Prediabetes: Secondary | ICD-10-CM | POA: Diagnosis not present

## 2022-04-07 DIAGNOSIS — Z7901 Long term (current) use of anticoagulants: Secondary | ICD-10-CM | POA: Diagnosis not present

## 2022-04-07 DIAGNOSIS — I129 Hypertensive chronic kidney disease with stage 1 through stage 4 chronic kidney disease, or unspecified chronic kidney disease: Secondary | ICD-10-CM | POA: Diagnosis not present

## 2022-05-19 ENCOUNTER — Encounter (INDEPENDENT_AMBULATORY_CARE_PROVIDER_SITE_OTHER): Payer: Self-pay | Admitting: *Deleted

## 2022-07-22 ENCOUNTER — Encounter (INDEPENDENT_AMBULATORY_CARE_PROVIDER_SITE_OTHER): Payer: Self-pay | Admitting: Gastroenterology

## 2022-07-23 DIAGNOSIS — R7303 Prediabetes: Secondary | ICD-10-CM | POA: Diagnosis not present

## 2022-07-23 DIAGNOSIS — E782 Mixed hyperlipidemia: Secondary | ICD-10-CM | POA: Diagnosis not present

## 2022-07-30 DIAGNOSIS — E782 Mixed hyperlipidemia: Secondary | ICD-10-CM | POA: Diagnosis not present

## 2022-07-30 DIAGNOSIS — G609 Hereditary and idiopathic neuropathy, unspecified: Secondary | ICD-10-CM | POA: Diagnosis not present

## 2022-07-30 DIAGNOSIS — E875 Hyperkalemia: Secondary | ICD-10-CM | POA: Diagnosis not present

## 2022-07-30 DIAGNOSIS — R600 Localized edema: Secondary | ICD-10-CM | POA: Diagnosis not present

## 2022-07-30 DIAGNOSIS — I482 Chronic atrial fibrillation, unspecified: Secondary | ICD-10-CM | POA: Diagnosis not present

## 2022-07-30 DIAGNOSIS — L723 Sebaceous cyst: Secondary | ICD-10-CM | POA: Insufficient documentation

## 2022-07-30 DIAGNOSIS — I10 Essential (primary) hypertension: Secondary | ICD-10-CM | POA: Diagnosis not present

## 2022-07-30 DIAGNOSIS — K056 Periodontal disease, unspecified: Secondary | ICD-10-CM | POA: Diagnosis not present

## 2022-07-30 DIAGNOSIS — F17201 Nicotine dependence, unspecified, in remission: Secondary | ICD-10-CM | POA: Diagnosis not present

## 2022-07-30 DIAGNOSIS — R7303 Prediabetes: Secondary | ICD-10-CM | POA: Diagnosis not present

## 2022-07-30 DIAGNOSIS — Z0001 Encounter for general adult medical examination with abnormal findings: Secondary | ICD-10-CM | POA: Diagnosis not present

## 2022-08-04 ENCOUNTER — Ambulatory Visit (INDEPENDENT_AMBULATORY_CARE_PROVIDER_SITE_OTHER): Payer: No Typology Code available for payment source | Admitting: Gastroenterology

## 2022-08-25 ENCOUNTER — Encounter: Payer: Self-pay | Admitting: Gastroenterology

## 2022-08-25 ENCOUNTER — Ambulatory Visit (INDEPENDENT_AMBULATORY_CARE_PROVIDER_SITE_OTHER): Payer: No Typology Code available for payment source | Admitting: Gastroenterology

## 2022-08-25 ENCOUNTER — Telehealth: Payer: Self-pay | Admitting: *Deleted

## 2022-08-25 VITALS — BP 124/65 | HR 76 | Temp 97.3°F | Ht 76.0 in | Wt 358.4 lb

## 2022-08-25 DIAGNOSIS — Z1211 Encounter for screening for malignant neoplasm of colon: Secondary | ICD-10-CM

## 2022-08-25 DIAGNOSIS — R195 Other fecal abnormalities: Secondary | ICD-10-CM | POA: Diagnosis not present

## 2022-08-25 NOTE — Progress Notes (Signed)
GI Office Note    Referring Provider: Celene Squibb, MD Primary Care Physician:  Celene Squibb, MD  Primary Gastroenterologist: Harvel Quale, MD  Chief Complaint   Chief Complaint  Patient presents with   New Patient (Initial Visit)    Blood in stool. Pt states he has had blood in stool all his life    History of Present Illness   Cory Petty is a 67 y.o. male presenting today at the request of Celene Squibb, MD for occult blood in stool.  Per referral paperwork from visit 05/08/22 who presented for lab follow-up.  Had labs in June 2023 with A1c 5.9, lipid panel normal, potassium 5.4, otherwise renal function and liver function within normal limits.  Hemoglobin 15, platelets 192.  Denied any GI complaints.  Per this paperwork it reports patient did stool color which showed trace amount of blood.  Given patient's referral paperwork he has a letter from [place] well test which states a positive result on 04/19/2022.  Suspect this is Cologuard.  Today:  Had cologaurd that he had done at home.   Reports he had blood in his stools since he was a kid, reporting that he had hemorrhoids at that time. Can tell when he has a hemorrhoid he has back pain. This is the only time that he sees blood.   Has never had a colonoscopy. Does not want one.   Had a right sided stroke in 2009 and has weak left side. No chest pain or shortness of breath. Does have some lower extremity edema. Does not feel like fluid pills are helping right now. Does have compressions socks he uses from time to time.   Has sleep study but noes not use a CPAP. Takes melatonin to help stay asleep. No constipation or diarrhea, lack of appetite, unintentional weight loss, N/V, reflux, early satiety, dysphagia.    Current Outpatient Medications  Medication Sig Dispense Refill   acetaminophen (TYLENOL) 500 MG tablet Take 500 mg by mouth as needed for headache.      apixaban (ELIQUIS) 5 MG TABS tablet Take 1 tablet (5  mg total) by mouth 2 (two) times daily. 60 tablet 6   atenolol (TENORMIN) 100 MG tablet Take 2 tablets (200 mg total) by mouth daily. 60 tablet 6   buPROPion (WELLBUTRIN XL) 150 MG 24 hr tablet Take 150 mg by mouth every morning.     furosemide (LASIX) 40 MG tablet Take 60 mg by mouth daily. May take an additional '20mg'$  daily as needed for swelling.     gabapentin (NEURONTIN) 300 MG capsule Take 300 mg by mouth daily.     HYDROcodone-acetaminophen (NORCO/VICODIN) 5-325 MG tablet Take 1 tablet by mouth 2 (two) times daily as needed.     Melatonin 10 MG TABS Take 2 tablets by mouth at bedtime.     Potassium 99 MG TABS Take 99 mg by mouth daily.     simvastatin (ZOCOR) 40 MG tablet Take 1 tablet (40 mg total) by mouth daily. 30 tablet 6   traMADol (ULTRAM) 50 MG tablet Take 50 mg by mouth 2 (two) times daily as needed.     No current facility-administered medications for this visit.    Past Medical History:  Diagnosis Date   CVA (cerebral vascular accident) ([place])    Remote, small   Hyperlipidemia    Hypersomnia with sleep apnea    Sleep Study (07/16/2010) AHI-2.3/hr, AHI during REM-9.4/hr, RDI-5.3/hr, REM during REM-12.8/hr, AVG O2 during  REM and NREM was 93.0%   Permanent atrial fibrillation [facility])     Past Surgical History:  Procedure Laterality Date   CARDIOVASCULAR STRESS TEST  06/18/2008   Perfusion defect in the inferior and apical myocardial region consistent with diaphragmatic attenuation. Remaining myocardium presents normal myocardial perfusion with no evidence of ischemia or infarct. EKG negative for ischemia.   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 in configuration or arterial feeders or venous drainage   TRANSTHORACIC ECHOCARDIOGRAM  08/01/2010   EF >55%, LA mild-moderately dilated,     Family History  Problem Relation Age of Onset   Cancer Mother        Lung cancer   Cancer Father        Stomach cancer    Cancer Brother     Allergies as of 08/25/2022   (No Known Allergies)    Social History   Socioeconomic History   Marital status: Widowed    Spouse name: Not on file   Number of children: Not on file   Years of education: Not on file   Highest education level: Not on file  Occupational History   Not on file  Tobacco Use   Smoking status: Every Day    Packs/day: 1.00    Years: 29.00    Total pack years: 29.00    Types: Cigarettes   Smokeless tobacco: Never  Substance and Sexual Activity   Alcohol use: Yes    Comment: 2 beers per month   Drug use: Not on file   Sexual activity: Not on file  Other Topics Concern   Not on file  Social History Narrative   Not on file   Social Determinants of Health   Financial Resource Strain: Not on file  Food Insecurity: Not on file  Transportation Needs: Not on file  Physical Activity: Not on file  Stress: Not on file  Social Connections: Not on file  Intimate Partner Violence: Not on file     Review of Systems   Gen: Denies any fever, chills, fatigue, weight loss, lack of appetite.  CV: Denies chest pain, heart palpitations, peripheral edema, syncope.  Resp: Denies shortness of breath at rest or with exertion. Denies wheezing or cough.  GI: see HPI GU : Denies urinary burning, urinary frequency, urinary hesitancy MS: Denies joint pain, muscle weakness, cramps, or limitation of movement.  Derm: Denies rash, itching, dry skin Psych: Denies depression, anxiety, memory loss, and confusion Heme: Denies bruising, bleeding, and enlarged lymph nodes.   Physical Exam   BP 124/65 (BP Location: Left Arm, Patient Position: Sitting, Cuff Size: Large)   Pulse 76   Temp (!) 97.3 F (36.3 C) (Temporal)   Ht '6\' 4"'$  (1.93 m)   Wt (!) 358 lb 6.4 oz (162.6 kg)   SpO2 96%   BMI 43.63 kg/m   General:   Alert and oriented. Pleasant and cooperative. Well-nourished and well-developed.  Head:  Normocephalic and atraumatic. Eyes:  Without  icterus, sclera clear and conjunctiva pink.  Ears:  Normal auditory acuity. Mouth:  No deformity or lesions, oral mucosa pink.  Lungs:  Clear to auscultation bilaterally. No wheezes, rales, or rhonchi. No distress.  Heart:  S1, S2 present without murmurs appreciated.  Abdomen:  +BS, soft, non-tender and non-distended. Rounded. No HSM noted. No guarding or rebound. No masses appreciated.  Rectal:  Deferred  Msk:  Symmetrical without gross deformities. Normal posture. Extremities:  Without edema. Neurologic:  Alert and  oriented x4;  grossly normal neurologically. Skin:  Intact without significant lesions or rashes. Psych:  Alert and cooperative. Normal mood and affect.   Assessment   Cory Petty is a 67 y.o. male with a history of CVA, A-fib on Eliquis, HLD, sleep apnea presenting today for positive Cologuard, heme positive stool.  Positive Cologuard, heme positive stool: Positive Cologuard in September 2023.  Has never had colonoscopy.  Reports chronic issues with blood being in the stools.  States has had multiple evaluations in the past and has been told he has hemorrhoids.  Usually has bleeding when he has flares of his hemorrhoids that is also associated with back pain.  Denies any overt GI bleeding.  No alarm symptoms present. We discussed the possibility of false positives and false negatives regarding Cologuard results.  He is hesitant to have colonoscopy however given he promised family that he would have a colonoscopy at some point he has agreed to proceed.  Occasional rectal bleeding could be benign anorectal source with hemorrhoids given he is on chronic anticoagulation for A-fib however given positive Cologuard would benefit from direct visualization.   PLAN   Proceed with colonoscopy with propofol by Dr. Jenetta Downer in near future: the risks, benefits, and alternatives have been discussed with the patient in detail. The patient states understanding and desires to proceed. ASA 3 (call  step daughter Levada Dy to set up appointment)  Clearance to hold Eliquis for 2 days.    Venetia Night, MSN, FNP-BC, AGACNP-BC [facility]  I have reviewed the note and agree with the APP's assessment as described in this progress note  Maylon Peppers, MD Gastroenterology and [facility]

## 2022-08-25 NOTE — Patient Instructions (Addendum)
We are scheduling you for a colonoscopy in the near future with Dr. Jenetta Downer.  We will request clearance from her cardiologist to hold your Eliquis for 2 days prior to your procedure.  Once we have obtained clearance we will call you to schedule your procedure. (I will have the staff call your stepdaughter Levada Dy to schedule.)  It was a pleasure meeting you today!  I want to create trusting relationships with patients. If you receive a survey regarding your visit,  I greatly appreciate you taking time to fill this out on paper or through your MyChart. I value your feedback.  Venetia Night, MSN, FNP-BC, AGACNP-BC [facility]

## 2022-08-25 NOTE — Telephone Encounter (Signed)
  Request for patient to stop medication prior to procedure or is needing cleareance  08/25/22  Cory Petty [birth year]  What type of surgery is being performed? Colonoscopy   When is surgery scheduled? TBD  What type of clearance is required (medical or pharmacy to hold medication or both? Hold medication  Are there any medications that need to be held prior to surgery and how long? Eliquis x 2 days prior to procedure  Name of physician performing surgery?  Felicita [facility] at [facility] Phone: [phone] Fax: [phone]  Anethesia type (none, local, MAC, general)? MAC

## 2022-08-26 NOTE — Telephone Encounter (Signed)
Patient with diagnosis of afib on Eliquis for anticoagulation.    Procedure: colonoscopy Date of procedure: TBD  CHA2DS2-VASc Score = 3  This indicates a 3.2% annual risk of stroke. The patient's score is based upon: CHF History: 0 HTN History: 0 Diabetes History: 0 Stroke History: 2 Vascular Disease History: 0 Age Score: 1 Gender Score: 0   CrCl >151m/min Platelet count 192K  Would see if MD performing procedure is ok with 1 day Eliquis hold prior to procedure given his normal renal function and elevated CV risk with prior stroke. He should resume anticoag as soon as safely possible after procedure.  **This guidance is not considered finalized until pre-operative APP has relayed final recommendations.**

## 2022-08-31 ENCOUNTER — Telehealth: Payer: Self-pay | Admitting: *Deleted

## 2022-08-31 NOTE — Telephone Encounter (Signed)
Pt agreeable to plan of care for tele visit 09/03/22 @ 10:20. Med rec and consent are done.      Patient Consent for Virtual Visit        Cory Petty has provided verbal consent on 08/31/2022 for a virtual visit (video or telephone).   CONSENT FOR VIRTUAL VISIT FOR:  Cory Petty  By participating in this virtual visit I agree to the following:  I hereby voluntarily request, consent and authorize [facility] and its employed or contracted physicians, physician assistants, nurse practitioners or other licensed health care professionals (the Practitioner), to provide me with telemedicine health care services (the "Services") as deemed necessary by the treating Practitioner. I acknowledge and consent to receive the Services by the Practitioner via telemedicine. I understand that the telemedicine visit will involve communicating with the Practitioner through live audiovisual communication technology and the disclosure of certain medical information by electronic transmission. I acknowledge that I have been given the opportunity to request an in-person assessment or other available alternative prior to the telemedicine visit and am voluntarily participating in the telemedicine visit.  I understand that I have the right to withhold or withdraw my consent to the use of telemedicine in the course of my care at any time, without affecting my right to future care or treatment, and that the Practitioner or I may terminate the telemedicine visit at any time. I understand that I have the right to inspect all information obtained and/or recorded in the course of the telemedicine visit and may receive copies of available information for a reasonable fee.  I understand that some of the potential risks of receiving the Services via telemedicine include:  Delay or interruption in medical evaluation due to technological equipment failure or disruption; Information transmitted may not be sufficient (e.g. poor  resolution of images) to allow for appropriate medical decision making by the Practitioner; and/or  In rare instances, security protocols could fail, causing a breach of personal health information.  Furthermore, I acknowledge that it is my responsibility to provide information about my medical history, conditions and care that is complete and accurate to the best of my ability. I acknowledge that Practitioner's advice, recommendations, and/or decision may be based on factors not within their control, such as incomplete or inaccurate data provided by me or distortions of diagnostic images or specimens that may result from electronic transmissions. I understand that the practice of medicine is not an exact science and that Practitioner makes no warranties or guarantees regarding treatment outcomes. I acknowledge that a copy of this consent can be made available to me via my patient portal ([facility]), or I can request a printed copy by calling the office of [facility].    I understand that my insurance will be billed for this visit.   I have read or had this consent read to me. I understand the contents of this consent, which adequately explains the benefits and risks of the Services being provided via telemedicine.  I have been provided ample opportunity to ask questions regarding this consent and the Services and have had my questions answered to my satisfaction. I give my informed consent for the services to be provided through the use of telemedicine in my medical care

## 2022-08-31 NOTE — Telephone Encounter (Signed)
Primary Cardiologist:Thomas Claiborne Billings, MD   Preoperative team, please contact this patient and set up a phone call appointment for further preoperative risk assessment. Please obtain consent and complete medication review. Thank you for your help.   I confirm that guidance regarding antiplatelet and oral anticoagulation therapy has been completed and, if necessary, noted below.   Emmaline Life, NP-C 08/31/2022, 8:48 AM [address], [address] Office [phone] Fax [phone]

## 2022-08-31 NOTE — Telephone Encounter (Signed)
Pt agreeable to plan of care for tele visit 09/03/22 @ 10:20. Med rec and consent are done.

## 2022-09-03 ENCOUNTER — Ambulatory Visit: Payer: No Typology Code available for payment source | Attending: Internal Medicine | Admitting: Physician Assistant

## 2022-09-03 DIAGNOSIS — Z01810 Encounter for preprocedural cardiovascular examination: Secondary | ICD-10-CM

## 2022-09-03 NOTE — Progress Notes (Signed)
Virtual Visit via Telephone Note   Because of Cory Petty's co-morbid illnesses, he is at least at moderate risk for complications without adequate follow up.  This format is felt to be most appropriate for this patient at this time.  The patient did not have access to video technology/had technical difficulties with video requiring transitioning to audio format only (telephone).  All issues noted in this document were discussed and addressed.  No physical exam could be performed with this format.  Please refer to the patient's chart for his consent to telehealth for [facility].  Evaluation Performed:  Preoperative cardiovascular risk assessment _____________   Date:  09/03/2022   Patient ID:  Cory Petty, DOB 1956-05-29, MRN [id number] Patient Location:  Home Provider location:   Office  Primary Care Provider:  Celene Squibb, MD Primary Cardiologist:  Shelva Majestic, MD  Chief Complaint / Patient Profile   67 y.o. 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  History of Present Illness    Cory Petty is a 67 y.o. male who presents via audio/video conferencing for a telehealth visit today.  Pt was last seen in cardiology clinic on 01/05/2022 by Dr. Shelva Majestic.  At that time Cory Petty was doing well.  The patient is now pending procedure as outlined above. Since his last visit, he has been doing well.  He has no cardiac awareness of atrial fibrillation.  He states permanent A-fib with atenolol as per control therapy.  I recommend continuing atenolol through the procedure.  Our clinical pharmacist reviewed the case, patient is at higher risk for stroke given permanent A-fib and prior history of CVA with residual left-sided weakness.  Although the  initial request is to hold Eliquis for 2 days, however our clinical pharmacist recommend hold Eliquis for 1 day prior to the procedure.  Past Medical History    Past Medical History:  Diagnosis Date   CVA (cerebral vascular accident) ([place])    Remote, small   Hyperlipidemia    Hypersomnia with sleep apnea    Sleep Study (07/16/2010) AHI-2.3/hr, AHI during REM-9.4/hr, RDI-5.3/hr, REM during REM-12.8/hr, AVG O2 during REM and NREM was 93.0%   Permanent atrial fibrillation [facility])    Past Surgical History:  Procedure Laterality Date   CARDIOVASCULAR STRESS TEST  06/18/2008   Perfusion defect in the inferior and apical myocardial region consistent with diaphragmatic attenuation. Remaining myocardium presents normal myocardial perfusion with no evidence of ischemia or infarct. EKG negative for ischemia.   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 in configuration or arterial feeders or venous drainage   TRANSTHORACIC ECHOCARDIOGRAM  08/01/2010   EF >55%, LA mild-moderately dilated,     Allergies  No Known Allergies  Home Medications    Prior to Admission medications   Medication Sig Start Date End Date Taking? Authorizing Provider  acetaminophen (TYLENOL) 500 MG tablet Take 500 mg by mouth as needed for headache.     [provider]  apixaban (ELIQUIS) 5 MG TABS tablet Take 1 tablet (5 mg total) by mouth 2 (two) times daily. 06/01/19   Troy Sine, MD  atenolol (TENORMIN) 100 MG tablet Take 2 tablets (200 mg total) by mouth daily. 02/13/19   Troy Sine, MD  buPROPion (  WELLBUTRIN XL) 150 MG 24 hr tablet Take 150 mg by mouth every morning. 02/12/21   [provider]  furosemide (LASIX) 40 MG tablet Take 60 mg by mouth daily. May take an additional '20mg'$  daily as needed for swelling.    [provider]  gabapentin (NEURONTIN) 300 MG capsule Take 300 mg by mouth daily.    [provider]   HYDROcodone-acetaminophen (NORCO/VICODIN) 5-325 MG tablet Take 1 tablet by mouth 2 (two) times daily as needed.    [provider]  Melatonin 10 MG TABS Take 2 tablets by mouth at bedtime.    [provider]  Potassium 99 MG TABS Take 99 mg by mouth daily.    [provider]  simvastatin (ZOCOR) 40 MG tablet Take 1 tablet (40 mg total) by mouth daily. 02/13/19   Troy Sine, MD  traMADol (ULTRAM) 50 MG tablet Take 50 mg by mouth 2 (two) times daily as needed. 12/17/21   [provider]    Physical Exam    Vital Signs:  Cory Petty does not have vital signs available for review today.  Given telephonic nature of communication, physical exam is limited. AAOx3. NAD. Normal affect.  Speech and respirations are unlabored.  Accessory Clinical Findings    None  Assessment & Plan    1.  Preoperative Cardiovascular Risk Assessment:  -Cory Petty is a pleasant 67 year old male with past medical history of permanent atrial fibrillation on Eliquis.  He has a history of CVA with residual left-sided weakness.  He has upcoming colonoscopy by Dr. Jenetta Downer.  He denies any recent exertional chest pain or worsening dyspnea.  He has no cardiac awareness of A-fib, his heart rate seems to be very well-controlled.  He is at acceptable risk to proceed from the cardiac perspective.  At the recommendation of our clinical pharmacist for reviewed his case, since he has normal renal function and elevated cerebrovascular risk with prior stroke, we recommend hold Eliquis for 1 day prior to the procedure and restart as soon as possible afterward at the discretion of Dr. Sarajane Marek based on bleeding risk.   The patient was advised that if he develops new symptoms prior to surgery to contact our office to arrange for a follow-up visit, and he verbalized understanding.   A copy of this note will be routed to requesting surgeon.  Time:   Today, I have spent 6 minutes with the patient with  telehealth technology discussing medical history, symptoms, and management plan.     [place], [place]  09/03/2022, 10:18 AM

## 2022-09-03 NOTE — Telephone Encounter (Signed)
Please see today's note regarding cardiac clearance.  I have forwarded my cardiac clearance note to Dr. Jenetta Downer.

## 2022-09-16 DIAGNOSIS — L729 Follicular cyst of the skin and subcutaneous tissue, unspecified: Secondary | ICD-10-CM | POA: Diagnosis not present

## 2022-09-16 DIAGNOSIS — L723 Sebaceous cyst: Secondary | ICD-10-CM | POA: Diagnosis not present

## 2022-09-16 DIAGNOSIS — I10 Essential (primary) hypertension: Secondary | ICD-10-CM | POA: Diagnosis not present

## 2022-09-16 DIAGNOSIS — Z6841 Body Mass Index (BMI) 40.0 and over, adult: Secondary | ICD-10-CM | POA: Diagnosis not present

## 2022-09-17 ENCOUNTER — Encounter: Payer: Self-pay | Admitting: *Deleted

## 2022-09-17 ENCOUNTER — Other Ambulatory Visit: Payer: Self-pay | Admitting: *Deleted

## 2022-09-17 DIAGNOSIS — D235 Other benign neoplasm of skin of trunk: Secondary | ICD-10-CM | POA: Insufficient documentation

## 2022-09-22 ENCOUNTER — Ambulatory Visit (INDEPENDENT_AMBULATORY_CARE_PROVIDER_SITE_OTHER): Payer: No Typology Code available for payment source | Admitting: Surgery

## 2022-09-22 ENCOUNTER — Encounter: Payer: Self-pay | Admitting: Surgery

## 2022-09-22 VITALS — BP 133/65 | HR 55 | Temp 98.1°F | Resp 16 | Ht 76.0 in | Wt 360.0 lb

## 2022-09-22 DIAGNOSIS — R222 Localized swelling, mass and lump, trunk: Secondary | ICD-10-CM | POA: Diagnosis not present

## 2022-09-23 NOTE — H&P (Signed)
[facility] History and Physical  Reason for Referral: Back cyst Referring Physician: Dr. Nevada Crane  Chief Complaint   New Patient (Initial Visit)     CORINTHIANS REGEN is a 67 y.o. male.  HPI: Patient presents for evaluation of a back cyst.  It has been present for over 2 years, and he has had his primary care doctor pop the area twice previously.  He denies ever having a true incision and drainage at this cyst.  Starting 1 month ago, it recurred and required drainage at his primary care doctor.  He was started on Keflex for 7 days a week ago.  He denies significant pain or tenderness at the area.  He confirms itching and drainage.  He describes the drainage as clear and a little red.  He denies any fevers or chills.  His past medical history significant for atrial fibrillation on Eliquis, stroke in 2009, and hyperlipidemia.  He takes Norco for chronic back pains.  He denies any histories of surgeries.  He quit smoking 2 years ago, occasionally drinks alcohol, and denies use of illicit drugs  Past Medical History:  Diagnosis Date   CVA (cerebral vascular accident) ([place])    Remote, small   Hyperlipidemia    Hypersomnia with sleep apnea    Sleep Study (07/16/2010) AHI-2.3/hr, AHI during REM-9.4/hr, RDI-5.3/hr, REM during REM-12.8/hr, AVG O2 during REM and NREM was 93.0%   Permanent atrial fibrillation [facility])     Past Surgical History:  Procedure Laterality Date   CARDIOVASCULAR STRESS TEST  06/18/2008   Perfusion defect in the inferior and apical myocardial region consistent with diaphragmatic attenuation. Remaining myocardium presents normal myocardial perfusion with no evidence of ischemia or infarct. EKG negative for ischemia.   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 in configuration or arterial feeders or venous drainage   TRANSTHORACIC ECHOCARDIOGRAM  08/01/2010   EF >55%, LA mild-moderately dilated,     Family  History  Problem Relation Age of Onset   Cancer Mother        Lung cancer   Cancer Father        Stomach cancer   Cancer Brother     Social History   Tobacco Use   Smoking status: Former    Packs/day: 1.00    Years: 29.00    Total pack years: 29.00    Types: Cigarettes    Quit date: 08/03/2020    Years since quitting: 2.1   Smokeless tobacco: Never  Substance Use Topics   Alcohol use: Yes    Comment: 2 beers per month    Medications: I have reviewed the patient's current medications. Allergies as of 09/22/2022   No Known Allergies      Medication List        Accurate as of September 22, 2022 11:59 PM. If you have any questions, ask your nurse or doctor.          apixaban 5 MG Tabs tablet Commonly known as: Eliquis Take 1 tablet (5 mg total) by mouth 2 (two) times daily.   atenolol 100 MG tablet Commonly known as: TENORMIN Take 2 tablets (200 mg total) by mouth daily.   buPROPion 150 MG 24 hr tablet Commonly known as: WELLBUTRIN XL Take 150 mg by mouth every morning.   furosemide 40 MG tablet Commonly known as: LASIX Take 60 mg by mouth daily. May take an additional 60m daily as needed for swelling.   gabapentin 300 MG capsule  Commonly known as: NEURONTIN Take 300 mg by mouth daily.   HYDROcodone-acetaminophen 5-325 MG tablet Commonly known as: NORCO/VICODIN Take 1 tablet by mouth 2 (two) times daily as needed.   Melatonin 10 MG Tabs Take 2 tablets by mouth at bedtime.   Potassium 99 MG Tabs Take 99 mg by mouth daily.   simvastatin 40 MG tablet Commonly known as: ZOCOR Take 1 tablet (40 mg total) by mouth daily.         ROS:  Constitutional: negative for chills, fatigue, and fevers Eyes: negative for visual disturbance and pain Ears, nose, mouth, throat, and face: positive for sinus problems, negative for ear drainage and sore throat Respiratory: positive for shortness of breath, negative for cough and wheezing Cardiovascular: negative  for chest pain and palpitations Gastrointestinal: negative for abdominal pain, nausea, reflux symptoms, and vomiting Genitourinary:positive for frequency, negative for dysuria Integument/breast: negative for dryness and rash Hematologic/lymphatic: negative for bleeding and lymphadenopathy Musculoskeletal:positive for back pain and joint pain Neurological: negative for dizziness and tremors Endocrine: negative for temperature intolerance  Blood pressure 133/65, pulse (!) 55, temperature 98.1 F (36.7 C), temperature source Oral, resp. rate 16, height 6' 4"$  (1.93 m), weight (!) 360 lb (163.3 kg), SpO2 97 %. Physical Exam Vitals reviewed.  Constitutional:      Appearance: Normal appearance.  HENT:     Head: Normocephalic and atraumatic.  Eyes:     Extraocular Movements: Extraocular movements intact.     Pupils: Pupils are equal, round, and reactive to light.  Cardiovascular:     Rate and Rhythm: Normal rate and regular rhythm.  Pulmonary:     Effort: Pulmonary effort is normal.     Breath sounds: Normal breath sounds.  Abdominal:     General: There is no distension.     Palpations: Abdomen is soft.     Tenderness: There is no abdominal tenderness.  Musculoskeletal:        General: Normal range of motion.     Cervical back: Normal range of motion.  Skin:    General: Skin is warm and dry.     Comments: 3-1/2 cm cyst in the midline upper buttock, no surrounding erythema, induration, or tenderness, punctate opening in the center with small amount of pus and sebum drainage  Neurological:     General: No focal deficit present.     Mental Status: He is alert and oriented to person, place, and time.  Psychiatric:        Mood and Affect: Mood normal.        Behavior: Behavior normal.     Results: No results found for this or any previous visit (from the past 48 hour(s)).  No results found.   Assessment & Plan:  ZADARIUS MARN is a 67 y.o. male who presents with back  cyst.  -Explained the pathophysiology of cyst, and when we recommend excision.  Given that this area is causing problems and continually draining, would recommend excision -Continue antibiotics -The risk and benefits of excision of back mass were discussed including but not limited to bleeding, infection, injury to surrounding structures, need for additional procedures, and poor wound healing.  After careful consideration, DEVREN RYTHER has decided to proceed with surgery. -Patient tentatively scheduled for surgery on 3/4 -Advised him that he will need to hold his Eliquis 2 days prior -Information provided to the patient regarding cysts  All questions were answered to the satisfaction of the patient.  Graciella Freer, DO [facility]  [address], [place] [phone] [phone] (office)

## 2022-09-23 NOTE — Progress Notes (Signed)
[facility] History and Physical  Reason for Referral: Back cyst Referring Physician: Dr. Nevada Crane  Chief Complaint   New Patient (Initial Visit)     Cory Petty is a 67 y.o. male.  HPI: Patient presents for evaluation of a back cyst.  It has been present for over 2 years, and he has had his primary care doctor pop the area twice previously.  He denies ever having a true incision and drainage at this cyst.  Starting 1 month ago, it recurred and required drainage at his primary care doctor.  He was started on Keflex for 7 days a week ago.  He denies significant pain or tenderness at the area.  He confirms itching and drainage.  He describes the drainage as clear and a little red.  He denies any fevers or chills.  His past medical history significant for atrial fibrillation on Eliquis, stroke in 2009, and hyperlipidemia.  He takes Norco for chronic back pains.  He denies any histories of surgeries.  He quit smoking 2 years ago, occasionally drinks alcohol, and denies use of illicit drugs  Past Medical History:  Diagnosis Date   CVA (cerebral vascular accident) ([place])    Remote, small   Hyperlipidemia    Hypersomnia with sleep apnea    Sleep Study (07/16/2010) AHI-2.3/hr, AHI during REM-9.4/hr, RDI-5.3/hr, REM during REM-12.8/hr, AVG O2 during REM and NREM was 93.0%   Permanent atrial fibrillation [facility])     Past Surgical History:  Procedure Laterality Date   CARDIOVASCULAR STRESS TEST  06/18/2008   Perfusion defect in the inferior and apical myocardial region consistent with diaphragmatic attenuation. Remaining myocardium presents normal myocardial perfusion with no evidence of ischemia or infarct. EKG negative for ischemia.   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 in configuration or arterial feeders or venous drainage   TRANSTHORACIC ECHOCARDIOGRAM  08/01/2010   EF >55%, LA mild-moderately dilated,     Family  History  Problem Relation Age of Onset   Cancer Mother        Lung cancer   Cancer Father        Stomach cancer   Cancer Brother     Social History   Tobacco Use   Smoking status: Former    Packs/day: 1.00    Years: 29.00    Total pack years: 29.00    Types: Cigarettes    Quit date: 08/03/2020    Years since quitting: 2.1   Smokeless tobacco: Never  Substance Use Topics   Alcohol use: Yes    Comment: 2 beers per month    Medications: I have reviewed the patient's current medications. Allergies as of 09/22/2022   No Known Allergies      Medication List        Accurate as of September 22, 2022 11:59 PM. If you have any questions, ask your nurse or doctor.          apixaban 5 MG Tabs tablet Commonly known as: Eliquis Take 1 tablet (5 mg total) by mouth 2 (two) times daily.   atenolol 100 MG tablet Commonly known as: TENORMIN Take 2 tablets (200 mg total) by mouth daily.   buPROPion 150 MG 24 hr tablet Commonly known as: WELLBUTRIN XL Take 150 mg by mouth every morning.   furosemide 40 MG tablet Commonly known as: LASIX Take 60 mg by mouth daily. May take an additional 64m daily as needed for swelling.   gabapentin 300 MG capsule  Commonly known as: NEURONTIN Take 300 mg by mouth daily.   HYDROcodone-acetaminophen 5-325 MG tablet Commonly known as: NORCO/VICODIN Take 1 tablet by mouth 2 (two) times daily as needed.   Melatonin 10 MG Tabs Take 2 tablets by mouth at bedtime.   Potassium 99 MG Tabs Take 99 mg by mouth daily.   simvastatin 40 MG tablet Commonly known as: ZOCOR Take 1 tablet (40 mg total) by mouth daily.         ROS:  Constitutional: negative for chills, fatigue, and fevers Eyes: negative for visual disturbance and pain Ears, nose, mouth, throat, and face: positive for sinus problems, negative for ear drainage and sore throat Respiratory: positive for shortness of breath, negative for cough and wheezing Cardiovascular: negative  for chest pain and palpitations Gastrointestinal: negative for abdominal pain, nausea, reflux symptoms, and vomiting Genitourinary:positive for frequency, negative for dysuria Integument/breast: negative for dryness and rash Hematologic/lymphatic: negative for bleeding and lymphadenopathy Musculoskeletal:positive for back pain and joint pain Neurological: negative for dizziness and tremors Endocrine: negative for temperature intolerance  Blood pressure 133/65, pulse (!) 55, temperature 98.1 F (36.7 C), temperature source Oral, resp. rate 16, height 6' 4"$  (1.93 m), weight (!) 360 lb (163.3 kg), SpO2 97 %. Physical Exam Vitals reviewed.  Constitutional:      Appearance: Normal appearance.  HENT:     Head: Normocephalic and atraumatic.  Eyes:     Extraocular Movements: Extraocular movements intact.     Pupils: Pupils are equal, round, and reactive to light.  Cardiovascular:     Rate and Rhythm: Normal rate and regular rhythm.  Pulmonary:     Effort: Pulmonary effort is normal.     Breath sounds: Normal breath sounds.  Abdominal:     General: There is no distension.     Palpations: Abdomen is soft.     Tenderness: There is no abdominal tenderness.  Musculoskeletal:        General: Normal range of motion.     Cervical back: Normal range of motion.  Skin:    General: Skin is warm and dry.     Comments: 3-1/2 cm cyst in the midline upper buttock, no surrounding erythema, induration, or tenderness, punctate opening in the center with small amount of pus and sebum drainage  Neurological:     General: No focal deficit present.     Mental Status: He is alert and oriented to person, place, and time.  Psychiatric:        Mood and Affect: Mood normal.        Behavior: Behavior normal.     Results: No results found for this or any previous visit (from the past 48 hour(s)).  No results found.   Assessment & Plan:  Cory Petty is a 67 y.o. male who presents with back  cyst.  -Explained the pathophysiology of cyst, and when we recommend excision.  Given that this area is causing problems and continually draining, would recommend excision -Continue antibiotics -The risk and benefits of excision of back mass were discussed including but not limited to bleeding, infection, injury to surrounding structures, need for additional procedures, and poor wound healing.  After careful consideration, Cory Petty has decided to proceed with surgery. -Patient tentatively scheduled for surgery on 3/4 -Advised him that he will need to hold his Eliquis 2 days prior -Information provided to the patient regarding cysts  All questions were answered to the satisfaction of the patient.  Graciella Freer, DO [facility]  [address], [place] [address] [phone] (office)

## 2022-09-28 ENCOUNTER — Telehealth: Payer: Self-pay | Admitting: *Deleted

## 2022-09-28 NOTE — Telephone Encounter (Signed)
Patient walked into the office asking about stopping eliquis prior to upcoming procedure on 10/05/22. He reported to the front desk staff he was told to stop his eliquis 10/02/22. According to clearance in his chart, he is to hold eliquis for 1 day, then restart asap. Unable to reach pt or leave a message mailbox is full on mobile and home number is disconnected

## 2022-09-30 NOTE — Patient Instructions (Signed)
Cory Petty  09/30/2022     '@[email or web address]'$ @   Your procedure is scheduled on  10/05/2022.   Report to [facility] at  0600  A.M.   Call this number if you have problems the morning of surgery:  [phone]  If you experience any cold or flu symptoms such as cough, fever, chills, shortness of breath, etc. between now and your scheduled surgery, please notify us at the above number.   Remember:  Do not eat or drink after midnight.      Take these medicines the morning of surgery with A SIP OF WATER           atenolol, wellbutrin, gabapentin, hydrocodone or tramadol(if needed).     Do not wear jewelry, make-up or nail polish.  Do not wear lotions, powders, or perfumes, or deodorant.  Do not shave 48 hours prior to surgery.  Men may shave face and neck.  Do not bring valuables to the hospital.  [facility] is not responsible for any belongings or valuables.  Contacts, dentures or bridgework may not be worn into surgery.  Leave your suitcase in the car.  After surgery it may be brought to your room.  For patients admitted to the hospital, discharge time will be determined by your treatment team.  Patients discharged the day of surgery will not be allowed to drive home and must have someone with them for 24 hours.    Special instructions:   DO NOT smoke tobacco or vape for 24 hours before your procedure.  Please read over the following fact sheets that you were given. Coughing and Deep Breathing, Surgical Site Infection Prevention, Anesthesia Post-op Instructions, and Care and Recovery After Surgery      Lipoma Removal, Care After The following information offers guidance on how to care for yourself after your procedure. Your health care provider may also give you more specific instructions. If you have problems or questions, contact your health care provider. What can I expect after the procedure? After the procedure, it is common to have: Mild  pain. Swelling. Bruising. Follow these instructions at home: Bathing  Do not take baths, swim, or use a hot tub until your health care provider approves. Ask your health care provider if you may take showers. You may only be allowed to take sponge baths. Keep your bandage (dressing) clean and dry until your health care provider says it can be removed. Incision care  Follow instructions from your health care provider about how to take care of your incision. Make sure you: Wash your hands with soap and water for at least 20 seconds before and after you change your dressing. If soap and water are not available, use hand sanitizer. Change your dressing as told by your health care provider. Leave stitches (sutures), skin glue, or adhesive strips in place. These skin closures may need to stay in place for 2 weeks or longer. If adhesive strip edges start to loosen and curl up, you may trim the loose edges. Do not remove adhesive strips completely unless your health care provider tells you to do that. Check your incision area every day for signs of infection. Check for: More redness, swelling, or pain. Fluid or blood. Warmth. Pus or a bad smell. Medicines Take over-the-counter and prescription medicines only as told by your health care provider. If you were prescribed an antibiotic medicine, use it as told by your health care provider. Do not stop  using the antibiotic even if you start to feel better. General instructions  If you were given a sedative during the procedure, it can affect you for several hours. Do not drive or operate machinery until your health care provider says that it is safe. Do not use any products that contain nicotine or tobacco before the procedure. These products include cigarettes, chewing tobacco, and vaping devices, such as e-cigarettes. These can delay healing after surgery. If you need help quitting, ask your health care provider. Return to your normal activities as  told by your health care provider. Ask your health care provider what activities are safe for you. Keep all follow-up visits. This is important. Contact a health care provider if: You have more redness, swelling, or pain around your incision. You have fluid or blood coming from your incision. Your incision feels warm to the touch. You have pus or a bad smell coming from your incision. You have pain that does not get better with medicine. Get help right away if: You have chills or a fever. You have severe pain. Summary After the procedure, it is common to have mild pain, swelling, and bruising. Follow instructions from your health care provider about how to take care of your incision. Contact a health care provider if you have signs of infection such as more redness, swelling, or pain. This information is not intended to replace advice given to you by your health care provider. Make sure you discuss any questions you have with your health care provider. Document Revised: 08/08/2021 Document Reviewed: 08/08/2021 Elsevier Patient Education  [place] Anesthesia, Adult, Care After The following information offers guidance on how to care for yourself after your procedure. Your health care provider may also give you more specific instructions. If you have problems or questions, contact your health care provider. What can I expect after the procedure? After the procedure, it is common for people to: Have pain or discomfort at the IV site. Have nausea or vomiting. Have a sore throat or hoarseness. Have trouble concentrating. Feel cold or chills. Feel weak, sleepy, or tired (fatigue). Have soreness and body aches. These can affect parts of the body that were not involved in surgery. Follow these instructions at home: For the time period you were told by your health care provider:  Rest. Do not participate in activities where you could fall or become injured. Do not drive or  use machinery. Do not drink alcohol. Do not take sleeping pills or medicines that cause drowsiness. Do not make important decisions or sign legal documents. Do not take care of children on your own. General instructions Drink enough fluid to keep your urine pale yellow. If you have sleep apnea, surgery and certain medicines can increase your risk for breathing problems. Follow instructions from your health care provider about wearing your sleep device: Anytime you are sleeping, including during daytime naps. While taking prescription pain medicines, sleeping medicines, or medicines that make you drowsy. Return to your normal activities as told by your health care provider. Ask your health care provider what activities are safe for you. Take over-the-counter and prescription medicines only as told by your health care provider. Do not use any products that contain nicotine or tobacco. These products include cigarettes, chewing tobacco, and vaping devices, such as e-cigarettes. These can delay incision healing after surgery. If you need help quitting, ask your health care provider. Contact a health care provider if: You have nausea or vomiting that does not get  better with medicine. You vomit every time you eat or drink. You have pain that does not get better with medicine. You cannot urinate or have bloody urine. You develop a skin rash. You have a fever. Get help right away if: You have trouble breathing. You have chest pain. You vomit blood. These symptoms may be an emergency. Get help right away. Call 911. Do not wait to see if the symptoms will go away. Do not drive yourself to the hospital. Summary After the procedure, it is common to have a sore throat, hoarseness, nausea, vomiting, or to feel weak, sleepy, or fatigue. For the time period you were told by your health care provider, do not drive or use machinery. Get help right away if you have difficulty breathing, have chest pain,  or vomit blood. These symptoms may be an emergency. This information is not intended to replace advice given to you by your health care provider. Make sure you discuss any questions you have with your health care provider. Document Revised: 10/17/2021 Document Reviewed: 10/17/2021 Elsevier Patient Education  [place]. How to Use Chlorhexidine Before Surgery Chlorhexidine gluconate (CHG) is a germ-killing (antiseptic) solution that is used to clean the skin. It can get rid of the bacteria that normally live on the skin and can keep them away for about 24 hours. To clean your skin with CHG, you may be given: A CHG solution to use in the shower or as part of a sponge bath. A prepackaged cloth that contains CHG. Cleaning your skin with CHG may help lower the risk for infection: While you are staying in the intensive care unit of the hospital. If you have a vascular access, such as a central line, to provide short-term or long-term access to your veins. If you have a catheter to drain urine from your bladder. If you are on a ventilator. A ventilator is a machine that helps you breathe by moving air in and out of your lungs. After surgery. What are the risks? Risks of using CHG include: A skin reaction. Hearing loss, if CHG gets in your ears and you have a perforated eardrum. Eye injury, if CHG gets in your eyes and is not rinsed out. The CHG product catching fire. Make sure that you avoid smoking and flames after applying CHG to your skin. Do not use CHG: If you have a chlorhexidine allergy or have previously reacted to chlorhexidine. On babies younger than 59 months of age. How to use CHG solution Use CHG only as told by your health care provider, and follow the instructions on the label. Use the full amount of CHG as directed. Usually, this is one bottle. During a shower Follow these steps when using CHG solution during a shower (unless your health care provider gives you different  instructions): Start the shower. Use your normal soap and shampoo to wash your face and hair. Turn off the shower or move out of the shower stream. Pour the CHG onto a clean washcloth. Do not use any type of brush or rough-edged sponge. Starting at your neck, lather your body down to your toes. Make sure you follow these instructions: If you will be having surgery, pay special attention to the part of your body where you will be having surgery. Scrub this area for at least 1 minute. Do not use CHG on your head or face. If the solution gets into your ears or eyes, rinse them well with water. Avoid your genital area. Avoid any  areas of skin that have broken skin, cuts, or scrapes. Scrub your back and under your arms. Make sure to wash skin folds. Let the lather sit on your skin for 1-2 minutes or as long as told by your health care provider. Thoroughly rinse your entire body in the shower. Make sure that all body creases and crevices are rinsed well. Dry off with a clean towel. Do not put any substances on your body afterward--such as powder, lotion, or perfume--unless you are told to do so by your health care provider. Only use lotions that are recommended by the manufacturer. Put on clean clothes or pajamas. If it is the night before your surgery, sleep in clean sheets.  During a sponge bath Follow these steps when using CHG solution during a sponge bath (unless your health care provider gives you different instructions): Use your normal soap and shampoo to wash your face and hair. Pour the CHG onto a clean washcloth. Starting at your neck, lather your body down to your toes. Make sure you follow these instructions: If you will be having surgery, pay special attention to the part of your body where you will be having surgery. Scrub this area for at least 1 minute. Do not use CHG on your head or face. If the solution gets into your ears or eyes, rinse them well with water. Avoid your genital  area. Avoid any areas of skin that have broken skin, cuts, or scrapes. Scrub your back and under your arms. Make sure to wash skin folds. Let the lather sit on your skin for 1-2 minutes or as long as told by your health care provider. Using a different clean, wet washcloth, thoroughly rinse your entire body. Make sure that all body creases and crevices are rinsed well. Dry off with a clean towel. Do not put any substances on your body afterward--such as powder, lotion, or perfume--unless you are told to do so by your health care provider. Only use lotions that are recommended by the manufacturer. Put on clean clothes or pajamas. If it is the night before your surgery, sleep in clean sheets. How to use CHG prepackaged cloths Only use CHG cloths as told by your health care provider, and follow the instructions on the label. Use the CHG cloth on clean, dry skin. Do not use the CHG cloth on your head or face unless your health care provider tells you to. When washing with the CHG cloth: Avoid your genital area. Avoid any areas of skin that have broken skin, cuts, or scrapes. Before surgery Follow these steps when using a CHG cloth to clean before surgery (unless your health care provider gives you different instructions): Using the CHG cloth, vigorously scrub the part of your body where you will be having surgery. Scrub using a back-and-forth motion for 3 minutes. The area on your body should be completely wet with CHG when you are done scrubbing. Do not rinse. Discard the cloth and let the area air-dry. Do not put any substances on the area afterward, such as powder, lotion, or perfume. Put on clean clothes or pajamas. If it is the night before your surgery, sleep in clean sheets.  For general bathing Follow these steps when using CHG cloths for general bathing (unless your health care provider gives you different instructions). Use a separate CHG cloth for each area of your body. Make sure you  wash between any folds of skin and between your fingers and toes. Wash your body in the following  order, switching to a new cloth after each step: The front of your neck, shoulders, and chest. Both of your arms, under your arms, and your hands. Your stomach and groin area, avoiding the genitals. Your right leg and foot. Your left leg and foot. The back of your neck, your back, and your buttocks. Do not rinse. Discard the cloth and let the area air-dry. Do not put any substances on your body afterward--such as powder, lotion, or perfume--unless you are told to do so by your health care provider. Only use lotions that are recommended by the manufacturer. Put on clean clothes or pajamas. Contact a health care provider if: Your skin gets irritated after scrubbing. You have questions about using your solution or cloth. You swallow any chlorhexidine. Call your local poison control center (1-(734)293-1290 in the [place] Get help right away if: Your eyes itch badly, or they become very red or swollen. Your skin itches badly and is red or swollen. Your hearing changes. You have trouble seeing. You have swelling or tingling in your mouth or throat. You have trouble breathing. These symptoms may represent a serious problem that is an emergency. Do not wait to see if the symptoms will go away. Get medical help right away. Call your local emergency services (911 in the [place] Do not drive yourself to the hospital. Summary Chlorhexidine gluconate (CHG) is a germ-killing (antiseptic) solution that is used to clean the skin. Cleaning your skin with CHG may help to lower your risk for infection. You may be given CHG to use for bathing. It may be in a bottle or in a prepackaged cloth to use on your skin. Carefully follow your health care provider's instructions and the instructions on the product label. Do not use CHG if you have a chlorhexidine allergy. Contact your health care provider if your skin gets  irritated after scrubbing. This information is not intended to replace advice given to you by your health care provider. Make sure you discuss any questions you have with your health care provider. Document Revised: 11/17/2021 Document Reviewed: 09/30/2020 Elsevier Patient Education  [place].

## 2022-10-01 ENCOUNTER — Encounter (HOSPITAL_COMMUNITY)
Admission: RE | Admit: 2022-10-01 | Discharge: 2022-10-01 | Disposition: A | Discharge status: Home / Self Care | Payer: No Typology Code available for payment source | Source: Ambulatory Visit | Attending: Surgery | Admitting: Surgery

## 2022-10-01 ENCOUNTER — Encounter (HOSPITAL_COMMUNITY): Payer: Self-pay

## 2022-10-01 VITALS — BP 133/59 | HR 68 | Temp 98.2°F | Resp 18 | Ht 76.0 in | Wt 360.0 lb

## 2022-10-01 DIAGNOSIS — Z79899 Other long term (current) drug therapy: Secondary | ICD-10-CM | POA: Diagnosis not present

## 2022-10-01 DIAGNOSIS — Z01812 Encounter for preprocedural laboratory examination: Secondary | ICD-10-CM | POA: Insufficient documentation

## 2022-10-01 LAB — BASIC METABOLIC PANEL
Anion gap: 8 (ref 5–15)
BUN: 20 mg/dL (ref 8–23)
CO2: 28 mmol/L (ref 22–32)
Calcium: 8.6 mg/dL — ABNORMAL LOW (ref 8.9–10.3)
Chloride: 101 mmol/L (ref 98–111)
Creatinine, Ser: 1.12 mg/dL (ref 0.61–1.24)
GFR, Estimated: >60 mL/min (ref >60)
Glucose, Bld: 124 mg/dL — ABNORMAL HIGH (ref 70–99)
Potassium: 4.1 mmol/L (ref 3.5–5.1)
Sodium: 137 mmol/L (ref 135–145)

## 2022-10-05 ENCOUNTER — Ambulatory Visit (HOSPITAL_COMMUNITY): Payer: No Typology Code available for payment source | Admitting: Certified Registered Nurse Anesthetist

## 2022-10-05 ENCOUNTER — Ambulatory Visit (HOSPITAL_BASED_OUTPATIENT_CLINIC_OR_DEPARTMENT_OTHER): Payer: No Typology Code available for payment source | Admitting: Certified Registered Nurse Anesthetist

## 2022-10-05 ENCOUNTER — Ambulatory Visit (HOSPITAL_COMMUNITY)
Admission: RE | Admit: 2022-10-05 | Discharge: 2022-10-05 | Disposition: A | Discharge status: Home / Self Care | Payer: No Typology Code available for payment source | Attending: Surgery | Admitting: Surgery

## 2022-10-05 ENCOUNTER — Encounter (HOSPITAL_COMMUNITY): Payer: Self-pay | Admitting: Surgery

## 2022-10-05 ENCOUNTER — Encounter (HOSPITAL_COMMUNITY)
Admission: RE | Disposition: A | Discharge status: Home / Self Care | Payer: Self-pay | Source: Home / Self Care | Attending: Surgery

## 2022-10-05 ENCOUNTER — Other Ambulatory Visit: Payer: Self-pay

## 2022-10-05 DIAGNOSIS — Z7901 Long term (current) use of anticoagulants: Secondary | ICD-10-CM | POA: Insufficient documentation

## 2022-10-05 DIAGNOSIS — E785 Hyperlipidemia, unspecified: Secondary | ICD-10-CM | POA: Diagnosis not present

## 2022-10-05 DIAGNOSIS — G8929 Other chronic pain: Secondary | ICD-10-CM | POA: Insufficient documentation

## 2022-10-05 DIAGNOSIS — L729 Follicular cyst of the skin and subcutaneous tissue, unspecified: Secondary | ICD-10-CM

## 2022-10-05 DIAGNOSIS — I10 Essential (primary) hypertension: Secondary | ICD-10-CM

## 2022-10-05 DIAGNOSIS — R222 Localized swelling, mass and lump, trunk: Secondary | ICD-10-CM | POA: Diagnosis not present

## 2022-10-05 DIAGNOSIS — Z8673 Personal history of transient ischemic attack (TIA), and cerebral infarction without residual deficits: Secondary | ICD-10-CM

## 2022-10-05 DIAGNOSIS — Z87891 Personal history of nicotine dependence: Secondary | ICD-10-CM | POA: Diagnosis not present

## 2022-10-05 DIAGNOSIS — Z79891 Long term (current) use of opiate analgesic: Secondary | ICD-10-CM | POA: Insufficient documentation

## 2022-10-05 DIAGNOSIS — L720 Epidermal cyst: Secondary | ICD-10-CM | POA: Diagnosis not present

## 2022-10-05 DIAGNOSIS — Z6841 Body Mass Index (BMI) 40.0 and over, adult: Secondary | ICD-10-CM | POA: Diagnosis not present

## 2022-10-05 DIAGNOSIS — I4821 Permanent atrial fibrillation: Secondary | ICD-10-CM | POA: Diagnosis not present

## 2022-10-05 HISTORY — PX: MASS EXCISION: SHX2000

## 2022-10-05 SURGERY — EXCISION MASS
Anesthesia: General | Site: Back

## 2022-10-05 MED ORDER — BACITRACIN ZINC 500 UNIT/GM EX OINT
TOPICAL_OINTMENT | CUTANEOUS | Status: AC
  Filled 2022-10-05: qty 0.9

## 2022-10-05 MED ORDER — LIDOCAINE HCL (CARDIAC) PF 100 MG/5ML IV SOSY
PREFILLED_SYRINGE | INTRAVENOUS | Status: DC | PRN
  Administered 2022-10-05: 60 mg via INTRATRACHEAL

## 2022-10-05 MED ORDER — ONDANSETRON HCL 4 MG/2ML IJ SOLN
INTRAMUSCULAR | Status: AC
  Filled 2022-10-05: qty 2

## 2022-10-05 MED ORDER — PROPOFOL 10 MG/ML IV BOLUS
INTRAVENOUS | Status: AC
  Filled 2022-10-05: qty 20

## 2022-10-05 MED ORDER — FENTANYL CITRATE PF 50 MCG/ML IJ SOSY: 25.0000 ug | PREFILLED_SYRINGE | INTRAMUSCULAR | Status: DC | PRN

## 2022-10-05 MED ORDER — ONDANSETRON HCL 4 MG/2ML IJ SOLN: 4.0000 mg | Freq: Once | INTRAMUSCULAR | Status: DC | PRN

## 2022-10-05 MED ORDER — 0.9 % SODIUM CHLORIDE (POUR BTL) OPTIME
TOPICAL | Status: DC | PRN
  Administered 2022-10-05: 1000 mL

## 2022-10-05 MED ORDER — CEFAZOLIN IN SODIUM CHLORIDE 3-0.9 GM/100ML-% IV SOLN
3.0000 g | INTRAVENOUS | Status: AC
  Administered 2022-10-05: 3 g via INTRAVENOUS
  Filled 2022-10-05: qty 100

## 2022-10-05 MED ORDER — SUCCINYLCHOLINE CHLORIDE 200 MG/10ML IV SOSY
PREFILLED_SYRINGE | INTRAVENOUS | Status: AC
  Filled 2022-10-05: qty 10

## 2022-10-05 MED ORDER — TRIPLE ANTIBIOTIC 5-400-5000 EX OINT: TOPICAL_OINTMENT | Freq: Every day | CUTANEOUS | 0 refills | Status: DC

## 2022-10-05 MED ORDER — BUPIVACAINE HCL (PF) 0.5 % IJ SOLN
INTRAMUSCULAR | Status: DC | PRN
  Administered 2022-10-05: 21 mL

## 2022-10-05 MED ORDER — CHLORHEXIDINE GLUCONATE CLOTH 2 % EX PADS: 6.0000 | MEDICATED_PAD | Freq: Once | CUTANEOUS | Status: DC

## 2022-10-05 MED ORDER — FENTANYL CITRATE (PF) 100 MCG/2ML IJ SOLN
INTRAMUSCULAR | Status: AC
  Filled 2022-10-05: qty 2

## 2022-10-05 MED ORDER — ONDANSETRON HCL 4 MG/2ML IJ SOLN
INTRAMUSCULAR | Status: DC | PRN
  Administered 2022-10-05: 4 mg via INTRAVENOUS

## 2022-10-05 MED ORDER — ACETAMINOPHEN 500 MG PO TABS: 500.0000 mg | ORAL_TABLET | Freq: Four times a day (QID) | ORAL | 0 refills | Status: AC

## 2022-10-05 MED ORDER — FENTANYL CITRATE (PF) 100 MCG/2ML IJ SOLN
INTRAMUSCULAR | Status: DC | PRN
  Administered 2022-10-05 (×2): 50 ug via INTRAVENOUS

## 2022-10-05 MED ORDER — PROPOFOL 10 MG/ML IV BOLUS
INTRAVENOUS | Status: DC | PRN
  Administered 2022-10-05: 300 mg via INTRAVENOUS

## 2022-10-05 MED ORDER — APIXABAN 5 MG PO TABS: 5.0000 mg | ORAL_TABLET | Freq: Two times a day (BID) | ORAL | 6 refills | Status: DC

## 2022-10-05 MED ORDER — SEVOFLURANE IN SOLN
RESPIRATORY_TRACT | Status: AC
  Filled 2022-10-05: qty 250

## 2022-10-05 MED ORDER — SUCCINYLCHOLINE CHLORIDE 200 MG/10ML IV SOSY
PREFILLED_SYRINGE | INTRAVENOUS | Status: DC | PRN
  Administered 2022-10-05: 200 mg via INTRAVENOUS

## 2022-10-05 MED ORDER — OXYCODONE HCL 5 MG/5ML PO SOLN: 5.0000 mg | Freq: Once | ORAL | Status: DC | PRN

## 2022-10-05 MED ORDER — OXYCODONE HCL 5 MG PO TABS: 5.0000 mg | ORAL_TABLET | Freq: Once | ORAL | Status: DC | PRN

## 2022-10-05 MED ORDER — LIDOCAINE HCL (PF) 2 % IJ SOLN
INTRAMUSCULAR | Status: AC
  Filled 2022-10-05: qty 5

## 2022-10-05 MED ORDER — BUPIVACAINE HCL (PF) 0.5 % IJ SOLN
INTRAMUSCULAR | Status: AC
  Filled 2022-10-05: qty 30

## 2022-10-05 MED ORDER — LACTATED RINGERS IV SOLN: INTRAVENOUS | Status: DC | PRN

## 2022-10-05 SURGICAL SUPPLY — 33 items
ADH SKN CLS APL DERMABOND .7 (GAUZE/BANDAGES/DRESSINGS)
APL PRP STRL LF ISPRP CHG 10.5 (MISCELLANEOUS) ×1
APPLICATOR CHLORAPREP 10.5 ORG (MISCELLANEOUS) ×1 IMPLANT
CLOTH BEACON ORANGE TIMEOUT ST (SAFETY) ×1 IMPLANT
COVER LIGHT HANDLE STERIS (MISCELLANEOUS) ×2 IMPLANT
DECANTER SPIKE VIAL GLASS SM (MISCELLANEOUS) ×1 IMPLANT
DERMABOND ADVANCED .7 DNX12 (GAUZE/BANDAGES/DRESSINGS) IMPLANT
DRAPE EENT ADH APERT 31X51 STR (DRAPES) IMPLANT
DRSG GAUZE PETRO 3X36 STRIP (GAUZE/BANDAGES/DRESSINGS) IMPLANT
DRSG TELFA 3X8 NADH STRL (GAUZE/BANDAGES/DRESSINGS) IMPLANT
ELECT NDL TIP 2.8 STRL (NEEDLE) IMPLANT
ELECT NEEDLE TIP 2.8 STRL (NEEDLE) IMPLANT
ELECT REM PT RETURN 9FT ADLT (ELECTROSURGICAL) ×1
ELECTRODE REM PT RTRN 9FT ADLT (ELECTROSURGICAL) ×1 IMPLANT
GAUZE SPONGE 4X4 12PLY STRL (GAUZE/BANDAGES/DRESSINGS) IMPLANT
GLOVE BIOGEL PI IND STRL 6.5 (GLOVE) ×1 IMPLANT
GLOVE BIOGEL PI IND STRL 7.0 (GLOVE) ×2 IMPLANT
GLOVE SURG SS PI 6.5 STRL IVOR (GLOVE) ×2 IMPLANT
GOWN STRL REUS W/TWL LRG LVL3 (GOWN DISPOSABLE) ×2 IMPLANT
KIT TURNOVER KIT A (KITS) ×1 IMPLANT
MANIFOLD NEPTUNE II (INSTRUMENTS) ×1 IMPLANT
NDL HYPO 25X1 1.5 SAFETY (NEEDLE) ×1 IMPLANT
NEEDLE HYPO 25X1 1.5 SAFETY (NEEDLE) ×1 IMPLANT
NS IRRIG 1000ML POUR BTL (IV SOLUTION) ×1 IMPLANT
PACK MINOR (CUSTOM PROCEDURE TRAY) IMPLANT
PAD ARMBOARD 7.5X6 YLW CONV (MISCELLANEOUS) ×1 IMPLANT
SET BASIN LINEN APH (SET/KITS/TRAYS/PACK) ×1 IMPLANT
SUT ETHILON 3 0 FSL (SUTURE) IMPLANT
SUT MNCRL AB 4-0 PS2 18 (SUTURE) ×1 IMPLANT
SUT VIC AB 3-0 SH 27 (SUTURE) ×1
SUT VIC AB 3-0 SH 27X BRD (SUTURE) ×1 IMPLANT
SYR CONTROL 10ML LL (SYRINGE) ×1 IMPLANT
TAPE CLOTH SURG 4X10 WHT LF (GAUZE/BANDAGES/DRESSINGS) IMPLANT

## 2022-10-05 NOTE — Transfer of Care (Signed)
Immediate Anesthesia Transfer of Care Note  Patient: Cory Petty  Procedure(s) Performed: EXCISION MASS, BACK (Back)  Patient Location: PACU  Anesthesia Type:General  Level of Consciousness: awake, alert , and oriented  Airway & Oxygen Therapy: Patient Spontanous Breathing and Patient connected to nasal cannula oxygen  Post-op Assessment: Report given to RN and Post -op Vital signs reviewed and stable  Post vital signs: Reviewed and stable  Last Vitals:  Vitals Value Taken Time  BP 138/86   Temp 97.8   Pulse 63 10/05/22 0928  Resp 10 10/05/22 0928  SpO2 99 % 10/05/22 0928  Vitals shown include unvalidated device data.  Last Pain:  Vitals:   10/05/22 [id number]  PainSc: 0-No pain         Complications: No notable events documented.

## 2022-10-05 NOTE — Discharge Instructions (Signed)
Ambulatory Surgery Discharge Instructions  General Anesthesia or Sedation Do not drive or operate heavy machinery for 24 hours.  Do not consume alcohol, tranquilizers, sleeping medications, or any non-prescribed medications for 24 hours. Do not make important decisions or sign any important papers in the next 24 hours. You should have someone with you tonight at home.  Activity  You are advised to go directly home from the hospital.  Restrict your activities and rest for a day.  Resume light activity tomorrow. No heavy lifting over 10 lbs or strenuous exercise.  Fluids and Diet Begin with clear liquids, bouillon, dry toast, soda crackers.  If not nauseated, you may go to a regular diet when you desire.  Greasy and spicy foods are not advised.  Medications  If you have not had a bowel movement in 24 hours, take 2 tablespoons over the counter Milk of mag.             You May resume your blood thinners on Wednesday, 3/6 You are being discharged with prescriptions for Opioid/Narcotic Medications: There are some specific considerations for these medications that you should know. Opioid Meds have risks & benefits. Addiction to these meds is always a concern with prolonged use Take medication only as directed Do not drive while taking narcotic pain medication Do not crush tablets or capsules Do not use a different container than medication was dispensed in Lock the container of medication in a cool, dry place out of reach of children and pets. Opioid medication can cause addiction Do not share with anyone else (this is a felony) Do not store medications for future use. Dispose of them properly.     Disposal:  Find a [employer] household drug take back site near you.  If you can't get to a drug take back site, use the recipe below as a last resort to dispose of expired, unused or unwanted drugs. Disposal  (Do not dispose chemotherapy drugs this way, talk to your prescribing doctor  instead.) Step 1: Mix drugs (do not crush) with dirt, kitty litter, or used coffee grounds and add a small amount of water to dissolve any solid medications. Step 2: Seal drugs in plastic bag. Step 3: Place plastic bag in trash. Step 4: Take prescription container and scratch out personal information, then recycle or throw away.  Operative Site  You have external stitches.  These will be removed in the office at your follow up appointment. Apply antibiotic ointment to the incision after showering.  Ok to Games developer. Keep wound clean and dry. No baths or swimming. No lifting more than 10 pounds.  Contact Information: If you have questions or concerns, please call our office, [phone], Monday- Thursday 8AM-5PM and Friday 8AM-12Noon.  If it is after hours or on the weekend, please call [facility] Main [facility], [phone], and ask to speak to the surgeon on call for Dr. Okey Dupre at [facility].   SPECIFIC COMPLICATIONS TO WATCH FOR: Inability to urinate Fever over 101? F by mouth Nausea and vomiting lasting longer than 24 hours. Pain not relieved by medication ordered Swelling around the operative site Increased redness, warmth, hardness, around operative area Numbness, tingling, or cold fingers or toes Blood -soaked dressing, (small amounts of oozing may be normal) Increasing and progressive drainage from surgical area or exam site

## 2022-10-05 NOTE — Progress Notes (Signed)
[facility]  Spoke with the patient's step-daughter in the consultation room.  I explained that he tolerated the procedure without difficulty.  He has external stitches that will need to be removed at his follow up appointment.  I am not giving him a prescription for narcotic pain medication, as he recently received a prescription for Ultram last month.  I also want him taking scheduled Tylenol.  The patient will follow-up with me in 2 weeks.  All questions were answered to her expressed satisfaction.  Graciella Freer, DO [facility] [address] [place], [place] [address] [phone] (office)

## 2022-10-05 NOTE — Op Note (Signed)
Is a [facility] Operative Note  10/05/22  Preoperative Diagnosis: Back mass   Postoperative Diagnosis: Same   Procedure(s) Performed: Excision of back mass   Surgeon: Graciella Freer, DO    Assistants: No qualified resident was available    Anesthesia: General endotracheal   Anesthesiologist: Dr. Briant Cedar    Specimens: Back mass, 4 x 2.5 cm   Estimated Blood Loss: Minimal   Blood Replacement: None    Complications: None   Wound Class: Contaminated   Operative Indications: Patient is a 67 year old male who presents for back mass excision.  He has had this mass present for 2 years, and it occasionally will drain.  He has never required incision and drainage, though he has had required treatment with antibiotics.  He is agreeable to excision of this mass.  All risks and benefits of performing this procedure were discussed with the patient including pain, infection, bleeding, damage to the surrounding structures, and need for more procedures or surgery. The patient voiced understanding of the procedure, all questions were sought and answered, and consent was obtained.  Findings: 4 x 2.5 cm back mass   Procedure: The patient was taken to the operating room and placed supine. General endotracheal anesthesia was induced. Intravenous antibiotics were administered per protocol.  The patient was positioned in a right lateral decubitus position.  The back was prepared and draped in the usual sterile fashion.  A time-out was completed verifying correct patient, procedure, site, positioning, and implant(s) and/or special equipment prior to beginning this procedure.  An elliptical incision was made overlying the mass. Using electrocautery, the dermis and subcutaneous tissues dissected around the mass. The mass was removed in its entirety and sent to pathology for evaluation.  The mass measured 4 x 2.5 cm in size hemostasis was achieved using electrocautery. The dermis was  approximated using 3-0 Vicryl sutures. The skin was closed with 3-0 nylon in a vertical mattress fashion.  The incision was dressed with bacitracin, Vaseline gauze, Telfa, 4 x 4, and Medipore tape.   Final inspection revealed acceptable hemostasis. All counts were correct at the end of the case. The patient was awakened from anesthesia and extubated without complication.  The patient went to the PACU in stable condition.   Graciella Freer, DO  [facility] [address] [place], [place] [address] [phone] (office)

## 2022-10-05 NOTE — Anesthesia Procedure Notes (Signed)
Procedure Name: Intubation Date/Time: 10/05/2022 7:42 AM  Performed by: Karna Dupes, CRNAPre-anesthesia Checklist: Patient identified, Emergency Drugs available, Suction available and Patient being monitored Patient Re-evaluated:Patient Re-evaluated prior to induction Oxygen Delivery Method: Circle system utilized Preoxygenation: Pre-oxygenation with 100% oxygen Induction Type: IV induction Ventilation: Mask ventilation without difficulty Laryngoscope Size: Glidescope and 3 Grade View: Grade I Tube type: Oral Tube size: 7.5 mm Number of attempts: 1 Airway Equipment and Method: Stylet Placement Confirmation: ETT inserted through vocal cords under direct vision, positive ETCO2 and breath sounds checked- equal and bilateral Secured at: 22 cm Tube secured with: Tape Dental Injury: Teeth and Oropharynx as per pre-operative assessment

## 2022-10-05 NOTE — Telephone Encounter (Signed)
Patient has had surgery

## 2022-10-05 NOTE — Anesthesia Preprocedure Evaluation (Signed)
Anesthesia Evaluation  Patient identified by MRN, date of birth, ID band Patient awake    Reviewed: Allergy & Precautions, H&P , NPO status , Patient's Chart, lab work & pertinent test results, reviewed documented beta blocker date and time   Airway Mallampati: II  TM Distance: >3 FB Neck ROM: full    Dental no notable dental hx.    Pulmonary neg pulmonary ROS, former smoker   Pulmonary exam normal breath sounds clear to auscultation       Cardiovascular Exercise Tolerance: Good hypertension,  Rhythm:regular Rate:Normal     Neuro/Psych  Neuromuscular disease CVA  negative psych ROS   GI/Hepatic negative GI ROS, Neg liver ROS,,,  Endo/Other    Morbid obesity  Renal/GU negative Renal ROS  negative genitourinary   Musculoskeletal   Abdominal   Peds  Hematology negative hematology ROS (+)   Anesthesia Other Findings   Reproductive/Obstetrics negative OB ROS                             Anesthesia Physical Anesthesia Plan  ASA: 3  Anesthesia Plan: General   Post-op Pain Management:    Induction:   PONV Risk Score and Plan: Ondansetron  Airway Management Planned:   Additional Equipment:   Intra-op Plan:   Post-operative Plan:   Informed Consent: I have reviewed the patients History and Physical, chart, labs and discussed the procedure including the risks, benefits and alternatives for the proposed anesthesia with the patient or authorized representative who has indicated his/her understanding and acceptance.     Dental Advisory Given  Plan Discussed with: CRNA  Anesthesia Plan Comments:        Anesthesia Quick Evaluation

## 2022-10-05 NOTE — Interval H&P Note (Signed)
History and Physical Interval Note:  10/05/2022 7:31 AM  Cory Petty  has presented today for surgery, with the diagnosis of CYST, BACK, 3 CM.  The various methods of treatment have been discussed with the patient and family. After consideration of risks, benefits and other options for treatment, the patient has consented to  Procedure(s): EXCISION MASS, BACK (N/A) as a surgical intervention.  The patient's history has been reviewed, patient examined, no change in status, stable for surgery.  I have reviewed the patient's chart and labs.  Questions were answered to the patient's satisfaction.     Hoot Owl

## 2022-10-06 LAB — SURGICAL PATHOLOGY

## 2022-10-06 NOTE — Anesthesia Postprocedure Evaluation (Signed)
Anesthesia Post Note  Patient: Cory Petty  Procedure(s) Performed: EXCISION MASS, BACK (Back)  Patient location during evaluation: Phase II Anesthesia Type: General Level of consciousness: awake Pain management: pain level controlled Vital Signs Assessment: post-procedure vital signs reviewed and stable Respiratory status: spontaneous breathing and respiratory function stable Cardiovascular status: blood pressure returned to baseline and stable Postop Assessment: no headache and no apparent nausea or vomiting Anesthetic complications: no Comments: Late entry   No notable events documented.   Last Vitals:  Vitals:   10/05/22 0945 10/05/22 0959  BP: 117/69 129/73  Pulse: 64 63  Resp: 13   Temp:  36.7 C  SpO2: 96% 100%    Last Pain:  Vitals:   10/06/22 1234  TempSrc:   PainSc: 0-No pain                 Louann Sjogren

## 2022-10-12 DIAGNOSIS — H5203 Hypermetropia, bilateral: Secondary | ICD-10-CM | POA: Diagnosis not present

## 2022-10-12 DIAGNOSIS — H5213 Myopia, bilateral: Secondary | ICD-10-CM | POA: Diagnosis not present

## 2022-10-15 NOTE — Telephone Encounter (Signed)
Should be OK to hold Eliquis for 24 hours Thanks

## 2022-10-15 NOTE — Telephone Encounter (Signed)
Clearance placed on providers desk. Please advise. Thank you

## 2022-10-16 ENCOUNTER — Encounter (HOSPITAL_COMMUNITY): Payer: Self-pay | Admitting: Surgery

## 2022-10-19 ENCOUNTER — Other Ambulatory Visit: Payer: Self-pay | Admitting: *Deleted

## 2022-10-19 ENCOUNTER — Encounter: Payer: Self-pay | Admitting: *Deleted

## 2022-10-19 ENCOUNTER — Ambulatory Visit (INDEPENDENT_AMBULATORY_CARE_PROVIDER_SITE_OTHER): Payer: No Typology Code available for payment source | Admitting: Surgery

## 2022-10-19 ENCOUNTER — Encounter: Payer: Self-pay | Admitting: Surgery

## 2022-10-19 VITALS — BP 147/84 | HR 70 | Temp 98.1°F | Resp 14 | Ht 76.0 in | Wt 360.0 lb

## 2022-10-19 DIAGNOSIS — Z09 Encounter for follow-up examination after completed treatment for conditions other than malignant neoplasm: Secondary | ICD-10-CM

## 2022-10-19 MED ORDER — PEG 3350-KCL-NA BICARB-NACL 420 G PO SOLR: 4000.0000 mL | Freq: Once | ORAL | 0 refills | Status: AC

## 2022-10-19 NOTE — Telephone Encounter (Signed)
Pt has been scheduled for 11/20/22. Instructions mailed and prep sent to the pharmacy.

## 2022-10-19 NOTE — Progress Notes (Signed)
[facility] Note   HPI:  67 y.o. Male presents to clinic for post-op follow-up status post back mass excision on 3/4.  He has overall been doing very well.  He denies any pain associated with his incision site, and denies fevers and chills.  He has had a small amount of bloody drainage along the middle of the incision site.  He otherwise has no complaints.  Tolerating a diet and moving his bowels.  Review of Systems:  All other review of systems: otherwise negative   Vital Signs:  BP (!) 147/84   Pulse 70   Temp 98.1 F (36.7 C) (Oral)   Resp 14   Ht 6\' 4"  (1.93 m)   Wt (!) 360 lb (163.3 kg)   SpO2 93%   BMI 43.82 kg/m    Physical Exam:  Physical Exam Vitals reviewed.  Constitutional:      Appearance: Normal appearance.  Skin:    General: Skin is warm and dry.     Comments: Incision site is healing well with sutures in place, mild erythema, nontender to palpation; palpable underlying fluid, likely seroma  Neurological:     Mental Status: He is alert.     Laboratory studies: None  Imaging:  None  Pathology: A. BACK MASS:  - Benign epidermal inclusion cyst   Assessment:  67 y.o. yo Male who presents for follow-up status post back mass excision on 3/4.  Plan:  -Overall patient is doing very well since surgery -Removed all but 3 stitches, maintaining the middle of 3 stitches, as this area appears to still be healing -Advised the patient that he can stop using antibiotic ointment, and leave the area open to air -I explained that he likely has a seroma under the skin, and if the area does open, it will have some drainage, likely serosanguineous -Follow up with me next week for remainder of suture removal  All of the above recommendations were discussed with the patient, and all of patient's questions were answered to his expressed satisfaction.  Graciella Freer, DO [facility] [address] [place], [place]  [address] [phone] (office)

## 2022-10-27 ENCOUNTER — Encounter: Payer: No Typology Code available for payment source | Admitting: Surgery

## 2022-10-29 ENCOUNTER — Other Ambulatory Visit: Payer: Self-pay

## 2022-10-29 ENCOUNTER — Encounter: Payer: Self-pay | Admitting: Surgery

## 2022-10-29 ENCOUNTER — Ambulatory Visit (INDEPENDENT_AMBULATORY_CARE_PROVIDER_SITE_OTHER): Payer: No Typology Code available for payment source | Admitting: Surgery

## 2022-10-29 VITALS — BP 120/75 | HR 68 | Temp 97.7°F | Resp 16 | Ht 76.0 in | Wt 354.0 lb

## 2022-10-29 DIAGNOSIS — Z09 Encounter for follow-up examination after completed treatment for conditions other than malignant neoplasm: Secondary | ICD-10-CM

## 2022-10-29 NOTE — Progress Notes (Signed)
[facility] Note   HPI:  67 y.o. Male presents to clinic for post-op follow-up status post back mass excision on 3/4.  He has been doing well since his visit last week.  Denies any pain at his incision site or drainage.  He states that the itching is also improved.  Denies fevers and chills.  Review of Systems:  All other review of systems: otherwise negative   Vital Signs:  BP 120/75   Pulse 68   Temp 97.7 F (36.5 C) (Oral)   Resp 16   Ht 6\' 4"  (1.93 m)   Wt (!) 354 lb (160.6 kg)   SpO2 95%   BMI 43.09 kg/m    Physical Exam:  Physical Exam Vitals reviewed.  Constitutional:      Appearance: Normal appearance.  Skin:    Comments: Back incision site healing well with sutures at midline of incision, no erythema or induration.  Nontender to palpation, palpable fluid underlying incision, likely seroma  Neurological:     Mental Status: He is alert.     Laboratory studies: None  Imaging:  None  Assessment:  67 y.o. yo Male who presents for follow-up status post back mass excision on 3/4  Plan:  -Patient continues to be doing well since surgery -Remainder of sutures removed -Advised him that he has a likely underlying seroma, but no need for intervention unless this causes him issues -Follow up as needed  All of the above recommendations were discussed with the patient, and all of patient's questions were answered to his expressed satisfaction.  Graciella Freer, DO [facility] [address] [place], [place] [address] [phone] (office)

## 2022-11-17 ENCOUNTER — Other Ambulatory Visit: Payer: Self-pay

## 2022-11-17 ENCOUNTER — Encounter (HOSPITAL_COMMUNITY)
Admission: RE | Admit: 2022-11-17 | Discharge: 2022-11-17 | Disposition: A | Discharge status: Home / Self Care | Payer: No Typology Code available for payment source | Source: Ambulatory Visit | Attending: Gastroenterology | Admitting: Gastroenterology

## 2022-11-17 ENCOUNTER — Encounter (HOSPITAL_COMMUNITY): Payer: Self-pay

## 2022-11-17 NOTE — Progress Notes (Signed)
   11/17/22 1312  OBSTRUCTIVE SLEEP APNEA  Have you ever been diagnosed with sleep apnea through a sleep study? No  Do you snore loudly (loud enough to be heard through closed doors)?  0  Do you often feel tired, fatigued, or sleepy during the daytime (such as falling asleep during driving or talking to someone)? 0  Has anyone observed you stop breathing during your sleep? 0  Do you have, or are you being treated for high blood pressure? 1  BMI more than 35 kg/m2? 1  Age > 50 (1-yes) 1  Neck circumference greater than:Male 16 inches or larger, Male 17inches or larger? 0  Male Gender (Yes=1) 1  Obstructive Sleep Apnea Score 4

## 2022-11-20 ENCOUNTER — Encounter (HOSPITAL_COMMUNITY)
Admission: RE | Disposition: A | Discharge status: Home / Self Care | Payer: Self-pay | Source: Home / Self Care | Attending: Gastroenterology

## 2022-11-20 ENCOUNTER — Ambulatory Visit (HOSPITAL_COMMUNITY)
Admission: RE | Admit: 2022-11-20 | Discharge: 2022-11-20 | Disposition: A | Discharge status: Home / Self Care | Payer: No Typology Code available for payment source | Attending: Gastroenterology | Admitting: Gastroenterology

## 2022-11-20 ENCOUNTER — Other Ambulatory Visit: Payer: Self-pay

## 2022-11-20 ENCOUNTER — Encounter (HOSPITAL_COMMUNITY): Payer: Self-pay | Admitting: Gastroenterology

## 2022-11-20 ENCOUNTER — Ambulatory Visit (HOSPITAL_BASED_OUTPATIENT_CLINIC_OR_DEPARTMENT_OTHER): Payer: No Typology Code available for payment source | Admitting: Anesthesiology

## 2022-11-20 ENCOUNTER — Ambulatory Visit (HOSPITAL_COMMUNITY): Payer: No Typology Code available for payment source | Admitting: Anesthesiology

## 2022-11-20 DIAGNOSIS — D120 Benign neoplasm of cecum: Secondary | ICD-10-CM | POA: Diagnosis not present

## 2022-11-20 DIAGNOSIS — Z87891 Personal history of nicotine dependence: Secondary | ICD-10-CM | POA: Insufficient documentation

## 2022-11-20 DIAGNOSIS — R195 Other fecal abnormalities: Secondary | ICD-10-CM | POA: Diagnosis not present

## 2022-11-20 DIAGNOSIS — D128 Benign neoplasm of rectum: Secondary | ICD-10-CM | POA: Insufficient documentation

## 2022-11-20 DIAGNOSIS — I69954 Hemiplegia and hemiparesis following unspecified cerebrovascular disease affecting left non-dominant side: Secondary | ICD-10-CM | POA: Insufficient documentation

## 2022-11-20 DIAGNOSIS — K648 Other hemorrhoids: Secondary | ICD-10-CM | POA: Diagnosis not present

## 2022-11-20 DIAGNOSIS — I10 Essential (primary) hypertension: Secondary | ICD-10-CM | POA: Diagnosis not present

## 2022-11-20 DIAGNOSIS — K625 Hemorrhage of anus and rectum: Secondary | ICD-10-CM | POA: Insufficient documentation

## 2022-11-20 DIAGNOSIS — I4821 Permanent atrial fibrillation: Secondary | ICD-10-CM | POA: Diagnosis not present

## 2022-11-20 DIAGNOSIS — Z1211 Encounter for screening for malignant neoplasm of colon: Secondary | ICD-10-CM | POA: Diagnosis not present

## 2022-11-20 DIAGNOSIS — E785 Hyperlipidemia, unspecified: Secondary | ICD-10-CM | POA: Diagnosis not present

## 2022-11-20 DIAGNOSIS — D123 Benign neoplasm of transverse colon: Secondary | ICD-10-CM | POA: Insufficient documentation

## 2022-11-20 DIAGNOSIS — Z6841 Body Mass Index (BMI) 40.0 and over, adult: Secondary | ICD-10-CM

## 2022-11-20 DIAGNOSIS — G8929 Other chronic pain: Secondary | ICD-10-CM | POA: Diagnosis not present

## 2022-11-20 DIAGNOSIS — I4891 Unspecified atrial fibrillation: Secondary | ICD-10-CM | POA: Diagnosis not present

## 2022-11-20 DIAGNOSIS — K635 Polyp of colon: Secondary | ICD-10-CM | POA: Diagnosis not present

## 2022-11-20 HISTORY — PX: COLONOSCOPY WITH PROPOFOL: SHX5780

## 2022-11-20 HISTORY — PX: POLYPECTOMY: SHX149

## 2022-11-20 HISTORY — DX: Cardiac arrhythmia, unspecified: I49.9

## 2022-11-20 LAB — HM COLONOSCOPY

## 2022-11-20 SURGERY — COLONOSCOPY WITH PROPOFOL
Anesthesia: General

## 2022-11-20 MED ORDER — PROPOFOL 10 MG/ML IV BOLUS
INTRAVENOUS | Status: DC | PRN
  Administered 2022-11-20 (×2): 50 mg via INTRAVENOUS
  Administered 2022-11-20: 100 mg via INTRAVENOUS

## 2022-11-20 MED ORDER — LACTATED RINGERS IV SOLN: INTRAVENOUS | Status: DC

## 2022-11-20 MED ORDER — ORAL CARE MOUTH RINSE: 15.0000 mL | Freq: Once | OROMUCOSAL | Status: DC

## 2022-11-20 MED ORDER — PHENYLEPHRINE 80 MCG/ML (10ML) SYRINGE FOR IV PUSH (FOR BLOOD PRESSURE SUPPORT)
PREFILLED_SYRINGE | INTRAVENOUS | Status: DC | PRN
  Administered 2022-11-20 (×3): 160 ug via INTRAVENOUS

## 2022-11-20 MED ORDER — CHLORHEXIDINE GLUCONATE 0.12 % MT SOLN: 15.0000 mL | Freq: Once | OROMUCOSAL | Status: DC

## 2022-11-20 MED ORDER — PHENYLEPHRINE 80 MCG/ML (10ML) SYRINGE FOR IV PUSH (FOR BLOOD PRESSURE SUPPORT)
PREFILLED_SYRINGE | INTRAVENOUS | Status: AC
  Filled 2022-11-20: qty 10

## 2022-11-20 MED ORDER — LIDOCAINE HCL (CARDIAC) PF 100 MG/5ML IV SOSY
PREFILLED_SYRINGE | INTRAVENOUS | Status: DC | PRN
  Administered 2022-11-20: 50 mg via INTRAVENOUS

## 2022-11-20 MED ORDER — PROPOFOL 500 MG/50ML IV EMUL
INTRAVENOUS | Status: DC | PRN
  Administered 2022-11-20: 150 ug/kg/min via INTRAVENOUS

## 2022-11-20 NOTE — Transfer of Care (Signed)
Immediate Anesthesia Transfer of Care Note  Patient: Cory Petty  Procedure(s) Performed: COLONOSCOPY WITH PROPOFOL POLYPECTOMY INTESTINAL  Patient Location: Short Stay  Anesthesia Type:General  Level of Consciousness: awake  Airway & Oxygen Therapy: Patient Spontanous Breathing  Post-op Assessment: Report given to RN and Post -op Vital signs reviewed and stable  Post vital signs: Reviewed and stable  Last Vitals:  Vitals Value Taken Time  BP    Temp    Pulse    Resp    SpO2      Last Pain:  Vitals:   11/20/22 0900  TempSrc:   PainSc: 0-No pain      Patients Stated Pain Goal: 8 (11/20/22 1610)  Complications: No notable events documented.

## 2022-11-20 NOTE — Discharge Instructions (Signed)
You are being discharged to home.  Resume your previous diet.  We are waiting for your pathology results.  Your physician has recommended a repeat colonoscopy in three years for surveillance.  Restart Eliquis tomorrow AM 

## 2022-11-20 NOTE — H&P (Signed)
Cory Petty is an 67 y.o. male.   Chief Complaint: positive Cologuard, rectal bleeding HPI: 67 year old male with past medical history of CVA, hyperlipidemia, A-fib, coming for evaluation of positive Cologuard and rectal bleeding.   Patient reports having intermittent rectal bleeding when he strains.  Had a positive Cologuard in September 2023.  Never had a colonoscopy in the past.  No family history of colon cancer.  The patient denies having any nausea, vomiting, fever, chills, melena, hematemesis, abdominal distention, abdominal pain, diarrhea, jaundice, pruritus or weight loss.   Past Medical History:  Diagnosis Date   CVA (cerebral vascular accident)    Remote, small   Dysrhythmia    Hyperlipidemia    Hypersomnia with sleep apnea    Sleep Study (07/16/2010) AHI-2.3/hr, AHI during REM-9.4/hr, RDI-5.3/hr, REM during REM-12.8/hr, AVG O2 during REM and NREM was 93.0%   Permanent atrial fibrillation     Past Surgical History:  Procedure Laterality Date   CARDIOVASCULAR STRESS TEST  06/18/2008   Perfusion defect in the inferior and apical myocardial region consistent with diaphragmatic attenuation. Remaining myocardium presents normal myocardial perfusion with no evidence of ischemia or infarct. EKG negative for ischemia.   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 His smoking use included cigarettes. He  has a 29.00 pack-year smoking history. He has never used smokeless tobacco. He reports current alcohol use. He reports that he does not use drugs.  Allergies: No Known Allergies  Medications Prior to Admission  Medication Sig Dispense Refill   acetaminophen (TYLENOL) 500 MG tablet Take 1,000 mg by mouth in the morning and at bedtime.     apixaban (ELIQUIS) 5 MG TABS tablet Take 1 tablet (5 mg total) by mouth 2 (two) times daily. 60 tablet 6   atenolol (TENORMIN) 100 MG tablet Take 2 tablets (200 mg total) by mouth daily. (Patient taking differently: Take 200 mg by mouth every evening.) 60 tablet 6   buPROPion (WELLBUTRIN XL) 150 MG 24 hr tablet Take 150 mg by mouth every morning.     diphenhydrAMINE (BENADRYL) 25 mg capsule Take 25 mg by mouth 2 (two) times daily as needed for allergies.     furosemide (LASIX) 40 MG tablet Take 60 mg by mouth in the morning.     gabapentin (NEURONTIN) 300 MG capsule Take 300 mg by mouth every evening.     HYDROcodone-acetaminophen (NORCO/VICODIN) 5-325 MG tablet Take 1 tablet by mouth 2 (two) times daily as needed for severe pain or moderate pain.     Melatonin 10 MG TABS Take 10 mg by mouth at bedtime.     Multiple Vitamins-Minerals (MULTIVITAMIN WITH MINERALS) tablet Take 1 tablet by mouth in the morning. Men's One A Day 50+     simvastatin (ZOCOR) 40 MG tablet Take 1 tablet (40 mg total) by mouth  daily. (Patient taking differently: Take 40 mg by mouth every evening.) 30 tablet 6   traMADol (ULTRAM) 50 MG tablet Take 50 mg by mouth every 12 (twelve) hours as needed for moderate pain or severe pain.      No results found for this or any previous visit (from the past 48 hour(s)). No results found.  Review of Systems  Blood pressure (!) 147/63, pulse 74, temperature 98.6 F (37 C), temperature source Oral, SpO2 94 %. Physical Exam  GENERAL: The patient is AO x3, in no acute distress. HEENT: Head is normocephalic and atraumatic. EOMI are intact. Mouth is  well hydrated and without lesions. NECK: Supple. No masses LUNGS: Clear to auscultation. No presence of rhonchi/wheezing/rales. Adequate chest expansion HEART: RRR, normal s1 and s2. ABDOMEN: Soft, nontender, no guarding, no peritoneal signs, and nondistended. BS +. No masses. EXTREMITIES: Without any cyanosis, clubbing, rash, lesions or edema. NEUROLOGIC: AOx3, no focal motor deficit. SKIN: no jaundice, no rashes  Assessment/Plan 67 year old male with past medical history of CVA, hyperlipidemia, A-fib, coming for evaluation of positive Cologuard and rectal bleeding.  Will proceed with colonoscopy. Dolores Frame, MD 11/20/2022, 8:27 AM

## 2022-11-20 NOTE — Anesthesia Procedure Notes (Signed)
Date/Time: 11/20/2022 9:03 AM  Performed by: Julian Reil, CRNAPre-anesthesia Checklist: Patient identified, Emergency Drugs available, Suction available and Patient being monitored Patient Re-evaluated:Patient Re-evaluated prior to induction Oxygen Delivery Method: Nasal cannula Induction Type: IV induction Placement Confirmation: positive ETCO2

## 2022-11-20 NOTE — Anesthesia Postprocedure Evaluation (Signed)
Anesthesia Post Note  Patient: Cory Petty  Procedure(s) Performed: COLONOSCOPY WITH PROPOFOL POLYPECTOMY INTESTINAL  Patient location during evaluation: Phase II Anesthesia Type: General Level of consciousness: awake and alert and oriented Pain management: pain level controlled Vital Signs Assessment: post-procedure vital signs reviewed and stable Respiratory status: spontaneous breathing, nonlabored ventilation and respiratory function stable Cardiovascular status: blood pressure returned to baseline and stable Postop Assessment: no apparent nausea or vomiting Anesthetic complications: no  No notable events documented.   Last Vitals:  Vitals:   11/20/22 0939 11/20/22 0945  BP: (!) 87/50 120/64  Pulse: 76 74  Resp: 16 15  Temp: 37 C   SpO2: 96% 97%    Last Pain:  Vitals:   11/20/22 0939  TempSrc: Oral  PainSc: 0-No pain                 St[place]man [place] C Shandrell Boda

## 2022-11-20 NOTE — Op Note (Signed)
[facility] Patient Name: Cory Petty Procedure Date: 11/20/2022 8:51 AM MRN: [id number] Date of Birth: 03-26-1956 Attending MD: Katrinka Blazing , , [id number] CSN: [id number] Age: 67 Admit Type: Outpatient Procedure:                Colonoscopy Indications:              Rectal bleeding, Positive Cologuard test Providers:                Katrinka Blazing, Sheran Fava, Crystal Page,                            Kristine L. Jessee Avers, Technician, Dyann Ruddle Referring MD:              Medicines:                Monitored Anesthesia Care Complications:            No immediate complications. Estimated Blood Loss:     Estimated blood loss: none. Procedure:                Pre-Anesthesia Assessment:                           - Prior to the procedure, a History and Physical                            was performed, and patient medications, allergies                            and sensitivities were reviewed. The patient's                            tolerance of previous anesthesia was reviewed.                           - The risks and benefits of the procedure and the                            sedation options and risks were discussed with the                            patient. All questions were answered and informed                            consent was obtained.                           - ASA Grade Assessment: III - A patient with severe                            systemic disease.                           After obtaining informed consent, the colonoscope                            was passed under direct vision.  Throughout the                            procedure, the patient's blood pressure, pulse, and                            oxygen saturations were monitored continuously. The                            PCF-HQ190L ([id number]) scope was introduced through                            the anus and advanced to the the cecum, identified                            by appendiceal  orifice and ileocecal valve. The                            colonoscopy was performed without difficulty. The                            patient tolerated the procedure well. The quality                            of the bowel preparation was adequate. Scope In: 9:08:48 AM Scope Out: 9:34:58 AM Scope Withdrawal Time: 0 hours 22 minutes 28 seconds  Total Procedure Duration: 0 hours 26 minutes 10 seconds  Findings:      The perianal and digital rectal examinations were normal.      Two sessile polyps were found in the cecum. The polyps were 1 mm in size.      A 5 mm polyp was found in the transverse colon. The polyp was sessile.       The polyp was removed with a cold snare. Resection and retrieval were       complete.      A 14 mm polyp was found in the rectum. The polyp was sessile. The polyp       was removed with a hot snare. Resection and retrieval were complete.      Non-bleeding internal hemorrhoids were found during retroflexion. The       hemorrhoids were moderate. Impression:               - Two 1 mm polyps in the cecum.                           - One 5 mm polyp in the transverse colon, removed                            with a cold snare. Resected and retrieved.                           - One 14 mm polyp in the rectum, removed with a hot                            snare. Resected and retrieved.                           -  Non-bleeding internal hemorrhoids. Moderate Sedation:      Per Anesthesia Care Recommendation:           - Discharge patient to home (ambulatory).                           - Resume previous diet.                           - Await pathology results.                           - Repeat colonoscopy in 3 years for surveillance.                           - Restart Eliquis tomorrow AM. Procedure Code(s):        --- Professional ---                           [phone], Colonoscopy, flexible; with removal of                            tumor(s), polyp(s), or other  lesion(s) by snare                            technique Diagnosis Code(s):        --- Professional ---                           D12.0, Benign neoplasm of cecum                           D12.3, Benign neoplasm of transverse colon (hepatic                            flexure or splenic flexure)                           D12.8, Benign neoplasm of rectum                           K64.8, Other hemorrhoids                           K62.5, Hemorrhage of anus and rectum                           R19.5, Other fecal abnormalities CPT copyright 2022 American [employer]. All rights reserved. The codes documented in this report are preliminary and upon coder review may  be revised to meet current compliance requirements. Katrinka Blazing, MD Katrinka Blazing,  11/20/2022 9:45:17 AM This report has been signed electronically. Number of Addenda: 0

## 2022-11-20 NOTE — Anesthesia Preprocedure Evaluation (Addendum)
Anesthesia Evaluation  Patient identified by MRN, date of birth, ID band Patient awake    Reviewed: Allergy & Precautions, H&P , NPO status , Patient's Chart, lab work & pertinent test results  Airway Mallampati: III  TM Distance: >3 FB Neck ROM: Full    Dental  (+) Dental Advisory Given, Missing   Pulmonary former smoker   Pulmonary exam normal breath sounds clear to auscultation       Cardiovascular hypertension, Pt. on medications + dysrhythmias Atrial Fibrillation  Rhythm:Irregular Rate:Normal + Systolic murmurs (??)    Neuro/Psych  Neuromuscular disease CVA (left sided weakness), Residual Symptoms  negative psych ROS   GI/Hepatic negative GI ROS, Neg liver ROS,,,  Endo/Other    Morbid obesity  Renal/GU negative Renal ROS  negative genitourinary   Musculoskeletal negative musculoskeletal ROS (+)    Abdominal   Peds negative pediatric ROS (+)  Hematology negative hematology ROS (+)   Anesthesia Other Findings Chronic low back pain  Reproductive/Obstetrics negative OB ROS                             Anesthesia Physical Anesthesia Plan  ASA: 3  Anesthesia Plan: General   Post-op Pain Management: Minimal or no pain anticipated   Induction: Intravenous  PONV Risk Score and Plan: 1 and Propofol infusion  Airway Management Planned: Nasal Cannula and Natural Airway  Additional Equipment:   Intra-op Plan:   Post-operative Plan:   Informed Consent: I have reviewed the patients History and Physical, chart, labs and discussed the procedure including the risks, benefits and alternatives for the proposed anesthesia with the patient or authorized representative who has indicated his/her understanding and acceptance.     Dental advisory given  Plan Discussed with: CRNA and Surgeon  Anesthesia Plan Comments:         Anesthesia Quick Evaluation

## 2022-11-23 LAB — SURGICAL PATHOLOGY

## 2022-11-24 ENCOUNTER — Encounter (INDEPENDENT_AMBULATORY_CARE_PROVIDER_SITE_OTHER): Payer: Self-pay | Admitting: *Deleted

## 2022-11-25 ENCOUNTER — Encounter (HOSPITAL_COMMUNITY): Payer: Self-pay | Admitting: Gastroenterology

## 2022-12-18 NOTE — Progress Notes (Signed)
Cardiology Office Note:    Date:  12/31/2022   ID:  MONSERRAT Petty, DOB 11/18/1955, MRN [id number]  PCP:  Benita Stabile, MD  Cardiologist:  Nicki Guadalajara, MD  Electrophysiologist:  None   Referring MD: Benita Stabile, MD   Chief Complaint: follow-up of atrial fibrillation   History of Present Illness:    Cory Petty is a 67 y.o. male with a history of permanent atrial fibrillation on Eliquisn, hyperlipidemia, CVA in 2009, mild sleep apnea not on CPAP, obesity, and tobacco abuse who is followed by Dr. [name] and presents today for routine follow-up of atrial fibrillation.   Patient has a long history of permanent atrial fibrillation. He used to be on chronic anticoagulation with Coumadin but is now on Eliquis. He suffered a small CVA in 2009 and has very minimal residua left sided arm weakness. 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 Last ischemic evaluation was a Myoview in 06/2008 showed no evidence of ischemia or prior infarction. Last Echo in 07/2010 showed LVEF >55%, moderate biatrial enlargement, and mild MR.   Patient was last seen by Dr. [name] in 01/2022 at which time he was gained weight but was otherwise doing well from a cardiac standpoint.   He presents today for follow-up. Here alone. Patient has done well since last visit. No chest pain, shortness of breath, orthopnea, PND. He has chronic lower extremity edema (mostly of left leg) which he has had since his stroke and is stable. He notes occasional palpitations if he is working hard outside but otherwise no palpitations. He also notes occasional dizziness if he is working outside in the heat and is not staying well hydrated. Otherwise, no lightheadedness or dizziness. No syncope. No abnormal bleeding on Eliquis. He stays activity doing strenuous yard work but does no formal exercise.  Past Medical History:  Diagnosis Date   CVA (cerebral vascular accident) (HCC)    Remote, small   Dysrhythmia     Hyperlipidemia    Hypersomnia with sleep apnea    Sleep Study (07/16/2010) AHI-2.3/hr, AHI during REM-9.4/hr, RDI-5.3/hr, REM during REM-12.8/hr, AVG O2 during REM and NREM was 93.0%   Permanent atrial fibrillation [facility])     Past Surgical History:  Procedure Laterality Date   CARDIOVASCULAR STRESS TEST  06/18/2008   Perfusion defect in the inferior and apical myocardial region consistent with diaphragmatic attenuation. Remaining myocardium presents normal myocardial perfusion with no evidence of ischemia or infarct. EKG negative for ischemia.   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 in configuration or arterial feeders or venous drainage   COLONOSCOPY WITH PROPOFOL N/A 11/20/2022   Procedure: COLONOSCOPY WITH PROPOFOL;  Surgeon: Dolores Frame, MD;  Location: [facility];  Service: Gastroenterology;  Laterality: N/A;  10:00 am   ELBOW SURGERY Right    MASS EXCISION N/A 10/05/2022   Procedure: EXCISION MASS, BACK;  Surgeon: Lewie Chamber, DO;  Location: [facility] ORS;  Service: General;  Laterality: N/A;   POLYPECTOMY  11/20/2022   Procedure: POLYPECTOMY INTESTINAL;  Surgeon: Marguerita Merles, Reuel Boom, MD;  Location: [facility];  Service: Gastroenterology;;   TRANSTHORACIC ECHOCARDIOGRAM  08/01/2010   EF >55%, LA mild-moderately dilated,     Current Medications: Current Meds  Medication Sig   acetaminophen (TYLENOL) 500 MG tablet Take 1,000 mg by mouth in the morning and at bedtime.   buPROPion (WELLBUTRIN XL) 150 MG 24 hr tablet Take 150  mg by mouth every morning.   diphenhydrAMINE (BENADRYL) 25 mg capsule Take 25 mg by mouth 2 (two) times daily as needed for allergies.   gabapentin (NEURONTIN) 300 MG capsule Take 300 mg by mouth every evening.   HYDROcodone-acetaminophen (NORCO/VICODIN) 5-325 MG tablet Take 1 tablet by mouth 2 (two) times daily as needed for severe pain or moderate pain.   Melatonin 10 MG TABS  Take 10 mg by mouth at bedtime.   Multiple Vitamins-Minerals (MULTIVITAMIN WITH MINERALS) tablet Take 1 tablet by mouth in the morning. Men's One A Day 50+   traMADol (ULTRAM) 50 MG tablet Take 50 mg by mouth every 12 (twelve) hours as needed for moderate pain or severe pain.   [DISCONTINUED] apixaban (ELIQUIS) 5 MG TABS tablet Take 1 tablet (5 mg total) by mouth 2 (two) times daily.   [DISCONTINUED] atenolol (TENORMIN) 100 MG tablet Take 2 tablets (200 mg total) by mouth daily. (Patient taking differently: Take 200 mg by mouth every evening.)   [DISCONTINUED] furosemide (LASIX) 40 MG tablet Take 60 mg by mouth in the morning.   [DISCONTINUED] simvastatin (ZOCOR) 40 MG tablet Take 1 tablet (40 mg total) by mouth daily. (Patient taking differently: Take 40 mg by mouth every evening.)     Allergies:   Patient has no known allergies.   Social History   Socioeconomic History   Marital status: Widowed    Spouse name: Not on file   Number of children: Not on file   Years of education: Not on file   Highest education level: Not on file  Occupational History   Not on file  Tobacco Use   Smoking status: Former    Packs/day: 1.00    Years: 29.00    Additional pack years: 0.00    Total pack years: 29.00    Types: Cigarettes    Quit date: 08/03/2020    Years since quitting: 2.4   Smokeless tobacco: Never  Substance and Sexual Activity   Alcohol use: Yes    Comment: 2 beers per month   Drug use: Never   Sexual activity: Not on file  Other Topics Concern   Not on file  Social History Narrative   Not on file   Social Determinants of Health   Financial Resource Strain: Not on file  Food Insecurity: Not on file  Transportation Needs: Not on file  Physical Activity: Not on file  Stress: Not on file  Social Connections: Not on file     Family History: The patient's family history includes Cancer in his brother, father, and mother.  ROS:   Please see the history of present illness.      EKGs/Labs/Other Studies Reviewed:    The following studies were reviewed:  Myoview 06/18/2008: Impressions: - Perfusion defect in the inferior and apical myocardial region consistent with diaphragmatic attenuation. - No evidence of ischemic or infarction in remaining myocardium.  - Low risk scan. _______________  Echocardiogram 08/01/2010: Impressions: - LVEF >55% - Biatrial enlargement - Aortic sclerosis and MAC - Mild MR - Trace TR - Normal RVSP - No atrial septal defect or PFO seen with saline microbubble contrast study  EKG:  EKG ordered today. EKG personally reviewed and demonstrates atrial fibrillation, rate 61 bpm, with RBBB and LAFB. Left axis deviation. QTc 459 ms.  Recent Labs: 10/01/2022: BUN 20; Creatinine, Ser 1.12; Potassium 4.1; Sodium 137  Recent Lipid Panel    Component Value Date/Time   CHOL 128 12/02/2017 1104   TRIG 57 12/02/2017  1104   HDL 42 12/02/2017 1104   CHOLHDL 3.0 12/02/2017 1104   CHOLHDL 2.7 12/03/2016 1020   VLDL 14 12/03/2016 1020   LDLCALC 75 12/02/2017 1104    Physical Exam:    Vital Signs: BP 110/60 (BP Location: Right Arm, Patient Position: Sitting, Cuff Size: Large)   Pulse 61   Ht 6\' 4"  (1.93 m)   Wt (!) 354 lb 3.2 oz (160.7 kg)   SpO2 97%   BMI 43.11 kg/m     Wt Readings from Last 3 Encounters:  12/31/22 (!) 354 lb 3.2 oz (160.7 kg)  11/17/22 (!) 354 lb (160.6 kg)  10/29/22 (!) 354 lb (160.6 kg)     General: 67 y.o. obese Caucasian male in no acute distress. HEENT: Normocephalic and atraumatic. Sclera clear.  Neck: Supple.  No JVD. Heart: RRR. No murmurs, gallops, or rubs.  Lungs: No increased work of breathing. Clear to ausculation bilaterally. No wheezes, rhonchi, or rales.  Abdomen: Soft, obese, and non-tender to palpation. .  Extremities: 1+ pitting edema of bilateral lower extremities (left leg chronically larger than the right). Skin: Warm and dry. Hyperpigmentation noted of left lower extremity consistent  with chronic venous stasis.  Neuro: Alert and oriented x3. No focal deficits. Psych: Normal affect. Responds appropriately.   Assessment:    1. Permanent atrial fibrillation (HCC)   2. Mild mitral regurgitation   3. Bilateral lower extremity edema   4. Hyperlipidemia, unspecified hyperlipidemia type   5. Morbid obesity (HCC)     Plan:    Permanent Atrial Fibrillation Rate controlled.  - Continue Atenolol 200mg  daily.  - Continue chronic anticoagulation with Eliquis 5mg  twice daily.   Mild Mitral Regurgitation Noted on Echo in 2011.  - No significant murmur noted on exam.  - Will repeat Echo for re-evaluation of this since it has been >10 years since last check.   Chronic Lower Extremity Edema Stable.  - Continue Lasix 60mg  daily.  Hyperlipidemia LDL 59 in 01/2022.  - Continue Simvastatin 40mg  daily.  - Labs followed by PCP.  Obesity BMI 43.  - Encouraged increasing physical activity (goal is 150 minutes of physical activity per week) and following a heart healthy diet.   Of note, provided refills of all cardiac medications today.  Disposition: Follow up in 1 year.   Medication Adjustments/Labs and Tests Ordered: Current medicines are reviewed at length with the patient today.  Concerns regarding medicines are outlined above.  Orders Placed This Encounter  Procedures   EKG 12-Lead   ECHOCARDIOGRAM COMPLETE   Meds ordered this encounter  Medications   apixaban (ELIQUIS) 5 MG TABS tablet    Sig: Take 1 tablet (5 mg total) by mouth 2 (two) times daily.    Dispense:  90 tablet    Refill:  3   furosemide (LASIX) 40 MG tablet    Sig: Take 1.5 tablets (60 mg total) by mouth in the morning.    Dispense:  135 tablet    Refill:  3   atenolol (TENORMIN) 100 MG tablet    Sig: Take 2 tablets (200 mg total) by mouth daily.    Dispense:  90 tablet    Refill:  3   simvastatin (ZOCOR) 40 MG tablet    Sig: Take 1 tablet (40 mg total) by mouth daily.    Dispense:  90  tablet    Refill:  3    Patient Instructions  Medication Instructions:  The current medical regimen is effective;  continue present  plan and medications as directed. Please refer to the Current Medication list given to you today.  *If you need a refill on your cardiac medications before your next appointment, please call your pharmacy*  Lab Work: NONE If you have labs (blood work) drawn today and your tests are completely normal, you will receive your results only by:  MyChart Message (if you have MyChart) OR A paper copy in the mail If you have any lab test that is abnormal or we need to change your treatment, we will call you to review the results.  Testing/Procedures: Echocardiogram - Your physician has requested that you have an echocardiogram. Echocardiography is a painless test that uses sound waves to create images of your heart. It provides your doctor with information about the size and shape of your heart and how well your heart's chambers and valves are working. This procedure takes approximately one hour. There are no restrictions for this procedure.   Follow-Up: At [facility], you and your health needs are our priority.  As part of our continuing mission to provide you with exceptional heart care, we have created designated Provider Care Teams.  These Care Teams include your primary Cardiologist (physician) and Advanced Practice Providers (APPs -  Physician Assistants and Nurse Practitioners) who all work together to provide you with the care you need, when you need it.  Your next appointment:   1 year(s)  Provider:   Nicki Guadalajara, MD  or Marjie Skiff, PA-C        Other Instructions     Signed, Corrin Parker, PA-C  12/31/2022 12:49 PM    [facility]

## 2022-12-31 ENCOUNTER — Ambulatory Visit: Payer: No Typology Code available for payment source | Attending: Student | Admitting: Student

## 2022-12-31 ENCOUNTER — Encounter: Payer: Self-pay | Admitting: Student

## 2022-12-31 VITALS — BP 110/60 | HR 61 | Ht 76.0 in | Wt 354.2 lb

## 2022-12-31 DIAGNOSIS — E785 Hyperlipidemia, unspecified: Secondary | ICD-10-CM | POA: Diagnosis not present

## 2022-12-31 DIAGNOSIS — R600 Localized edema: Secondary | ICD-10-CM | POA: Diagnosis not present

## 2022-12-31 DIAGNOSIS — I4821 Permanent atrial fibrillation: Secondary | ICD-10-CM | POA: Diagnosis not present

## 2022-12-31 DIAGNOSIS — I340 Nonrheumatic mitral (valve) insufficiency: Secondary | ICD-10-CM

## 2022-12-31 MED ORDER — SIMVASTATIN 40 MG PO TABS: 40.0000 mg | ORAL_TABLET | Freq: Every day | ORAL | 3 refills | Status: DC

## 2022-12-31 MED ORDER — APIXABAN 5 MG PO TABS
5.0000 mg | ORAL_TABLET | Freq: Two times a day (BID) | ORAL | 3 refills | Status: AC
  Filled 2023-05-03: qty 90, 45d supply, fill #0
  Filled 2023-06-14 – 2023-06-15 (×2): qty 90, 45d supply, fill #1

## 2022-12-31 MED ORDER — ATENOLOL 100 MG PO TABS: 200.0000 mg | ORAL_TABLET | Freq: Every day | ORAL | 3 refills | Status: DC

## 2022-12-31 MED ORDER — FUROSEMIDE 40 MG PO TABS: 60.0000 mg | ORAL_TABLET | Freq: Every morning | ORAL | 3 refills | Status: DC

## 2022-12-31 NOTE — Patient Instructions (Signed)
Medication Instructions:  The current medical regimen is effective;  continue present plan and medications as directed. Please refer to the Current Medication list given to you today.  *If you need a refill on your cardiac medications before your next appointment, please call your pharmacy*  Lab Work: NONE If you have labs (blood work) drawn today and your tests are completely normal, you will receive your results only by:  MyChart Message (if you have MyChart) OR A paper copy in the mail If you have any lab test that is abnormal or we need to change your treatment, we will call you to review the results.  Testing/Procedures: Echocardiogram - Your physician has requested that you have an echocardiogram. Echocardiography is a painless test that uses sound waves to create images of your heart. It provides your doctor with information about the size and shape of your heart and how well your heart's chambers and valves are working. This procedure takes approximately one hour. There are no restrictions for this procedure.   Follow-Up: At [facility], you and your health needs are our priority.  As part of our continuing mission to provide you with exceptional heart care, we have created designated Provider Care Teams.  These Care Teams include your primary Cardiologist (physician) and Advanced Practice Providers (APPs -  Physician Assistants and Nurse Practitioners) who all work together to provide you with the care you need, when you need it.  Your next appointment:   1 year(s)  Provider:   Nicki Guadalajara, MD  or Marjie Skiff, PA-C        Other Instructions

## 2023-01-25 DIAGNOSIS — R7303 Prediabetes: Secondary | ICD-10-CM | POA: Diagnosis not present

## 2023-01-25 DIAGNOSIS — E782 Mixed hyperlipidemia: Secondary | ICD-10-CM | POA: Diagnosis not present

## 2023-01-25 DIAGNOSIS — B029 Zoster without complications: Secondary | ICD-10-CM | POA: Diagnosis not present

## 2023-01-26 LAB — LAB REPORT - SCANNED
A1c: 6.1
Albumin, Urine POC: 3.9
Albumin/Creatinine Ratio, Urine, POC: 16
Creatinine, POC: 24 mg/dL
EGFR: 80

## 2023-01-28 ENCOUNTER — Ambulatory Visit: Payer: No Typology Code available for payment source | Attending: Student

## 2023-01-28 DIAGNOSIS — I340 Nonrheumatic mitral (valve) insufficiency: Secondary | ICD-10-CM

## 2023-01-28 LAB — ECHOCARDIOGRAM COMPLETE
AR max vel: 2.35 cm2
AV Area VTI: 2.4 cm2
AV Area mean vel: 2.46 cm2
AV Mean grad: 6.2 mmHg
AV Peak grad: 14.8 mmHg
Ao pk vel: 1.92 m/s
Area-P 1/2: 2.62 cm2
Calc EF: 59.2 %
MV M vel: 3.74 m/s
MV Peak grad: 56 mmHg
S' Lateral: 4.8 cm
Single Plane A2C EF: 56.5 %
Single Plane A4C EF: 64.1 %

## 2023-02-01 DIAGNOSIS — F17201 Nicotine dependence, unspecified, in remission: Secondary | ICD-10-CM | POA: Diagnosis not present

## 2023-02-01 DIAGNOSIS — E875 Hyperkalemia: Secondary | ICD-10-CM | POA: Diagnosis not present

## 2023-02-01 DIAGNOSIS — I482 Chronic atrial fibrillation, unspecified: Secondary | ICD-10-CM | POA: Diagnosis not present

## 2023-02-01 DIAGNOSIS — M25562 Pain in left knee: Secondary | ICD-10-CM | POA: Diagnosis not present

## 2023-02-01 DIAGNOSIS — R600 Localized edema: Secondary | ICD-10-CM | POA: Diagnosis not present

## 2023-02-01 DIAGNOSIS — M25561 Pain in right knee: Secondary | ICD-10-CM | POA: Diagnosis not present

## 2023-02-01 DIAGNOSIS — K054 Periodontosis: Secondary | ICD-10-CM | POA: Diagnosis not present

## 2023-02-01 DIAGNOSIS — I10 Essential (primary) hypertension: Secondary | ICD-10-CM | POA: Diagnosis not present

## 2023-02-01 DIAGNOSIS — L723 Sebaceous cyst: Secondary | ICD-10-CM | POA: Diagnosis not present

## 2023-02-01 DIAGNOSIS — G609 Hereditary and idiopathic neuropathy, unspecified: Secondary | ICD-10-CM | POA: Diagnosis not present

## 2023-02-01 DIAGNOSIS — E782 Mixed hyperlipidemia: Secondary | ICD-10-CM | POA: Diagnosis not present

## 2023-02-12 DIAGNOSIS — M25562 Pain in left knee: Secondary | ICD-10-CM | POA: Diagnosis not present

## 2023-02-12 DIAGNOSIS — M25561 Pain in right knee: Secondary | ICD-10-CM | POA: Diagnosis not present

## 2023-03-15 ENCOUNTER — Other Ambulatory Visit: Payer: Self-pay | Admitting: Orthopedic Surgery

## 2023-03-15 DIAGNOSIS — S83249A Other tear of medial meniscus, current injury, unspecified knee, initial encounter: Secondary | ICD-10-CM

## 2023-03-15 DIAGNOSIS — M25561 Pain in right knee: Secondary | ICD-10-CM | POA: Diagnosis not present

## 2023-03-15 DIAGNOSIS — M25562 Pain in left knee: Secondary | ICD-10-CM | POA: Diagnosis not present

## 2023-03-31 ENCOUNTER — Other Ambulatory Visit (HOSPITAL_COMMUNITY): Payer: Self-pay

## 2023-03-31 MED ORDER — APIXABAN 5 MG PO TABS
5.0000 mg | ORAL_TABLET | Freq: Two times a day (BID) | ORAL | 0 refills | Status: DC
  Filled 2023-03-31 (×2): qty 60, 30d supply, fill #0

## 2023-04-01 ENCOUNTER — Other Ambulatory Visit (HOSPITAL_COMMUNITY): Payer: Self-pay

## 2023-04-01 MED ORDER — ATENOLOL 100 MG PO TABS: 100.0000 mg | ORAL_TABLET | Freq: Two times a day (BID) | ORAL | 1 refills | Status: DC

## 2023-04-01 MED ORDER — HYDROCODONE-ACETAMINOPHEN 5-325 MG PO TABS: 1.0000 | ORAL_TABLET | Freq: Two times a day (BID) | ORAL | 0 refills | Status: DC | PRN

## 2023-04-01 MED ORDER — BUPROPION HCL ER (XL) 150 MG PO TB24
150.0000 mg | ORAL_TABLET | Freq: Every day | ORAL | 1 refills | Status: AC
  Filled 2023-05-03: qty 90, 90d supply, fill #0

## 2023-04-01 MED ORDER — GABAPENTIN 300 MG PO CAPS
300.0000 mg | ORAL_CAPSULE | Freq: Every day | ORAL | 2 refills | Status: AC
  Filled 2023-05-03: qty 90, 90d supply, fill #0

## 2023-04-02 ENCOUNTER — Other Ambulatory Visit: Payer: Self-pay

## 2023-04-02 ENCOUNTER — Other Ambulatory Visit (HOSPITAL_COMMUNITY): Payer: Self-pay

## 2023-04-12 ENCOUNTER — Other Ambulatory Visit (HOSPITAL_COMMUNITY): Payer: Self-pay

## 2023-04-13 ENCOUNTER — Ambulatory Visit
Admission: RE | Admit: 2023-04-13 | Discharge: 2023-04-13 | Disposition: A | Discharge status: Home / Self Care | Payer: No Typology Code available for payment source | Source: Ambulatory Visit | Attending: Orthopedic Surgery

## 2023-04-13 DIAGNOSIS — M25462 Effusion, left knee: Secondary | ICD-10-CM | POA: Diagnosis not present

## 2023-04-13 DIAGNOSIS — M23322 Other meniscus derangements, posterior horn of medial meniscus, left knee: Secondary | ICD-10-CM | POA: Diagnosis not present

## 2023-04-13 DIAGNOSIS — M23352 Other meniscus derangements, posterior horn of lateral meniscus, left knee: Secondary | ICD-10-CM | POA: Diagnosis not present

## 2023-04-13 DIAGNOSIS — M25562 Pain in left knee: Secondary | ICD-10-CM | POA: Diagnosis not present

## 2023-04-13 DIAGNOSIS — S83249A Other tear of medial meniscus, current injury, unspecified knee, initial encounter: Secondary | ICD-10-CM

## 2023-04-14 ENCOUNTER — Other Ambulatory Visit (HOSPITAL_COMMUNITY): Payer: Self-pay

## 2023-04-21 ENCOUNTER — Telehealth: Payer: Self-pay

## 2023-04-21 DIAGNOSIS — M25562 Pain in left knee: Secondary | ICD-10-CM | POA: Diagnosis not present

## 2023-04-21 DIAGNOSIS — M25561 Pain in right knee: Secondary | ICD-10-CM | POA: Diagnosis not present

## 2023-04-21 NOTE — Telephone Encounter (Signed)
Pre-operative Risk Assessment    Patient Name: Cory Petty  DOB: 1956/07/24 MRN: [id number]   DATE OF LAST VISIT: 12/31/22  UPCOMING VISIT: [facility]   Request for Surgical Clearance    Procedure:  Left knee scope   Date of Surgery:  Clearance TBD                                 Surgeon:  Dr. Margarita Rana  Surgeon's Group or Practice Name:  [facility]  Phone number:  [phone] Fax number:  [phone]   Type of Clearance Requested:   - Pharmacy:  Hold Apixaban (Eliquis)     Type of Anesthesia:  Choice    Additional requests/questions:    Scarlette Shorts   04/21/2023, 5:08 PM

## 2023-04-23 ENCOUNTER — Telehealth: Payer: Self-pay

## 2023-04-23 NOTE — Telephone Encounter (Signed)
Pt is scheduled for tele preop 10/2 at 1:40pm. Med rec and consnet done

## 2023-04-23 NOTE — Telephone Encounter (Signed)
Patient with diagnosis of afib on Eliquis for anticoagulation.    Procedure: Left knee scope  Date of procedure: TBD   CHA2DS2-VASc Score = 3   This indicates a 3.2% annual risk of stroke. The patient's score is based upon: CHF History: 0 HTN History: 0 Diabetes History: 0 Stroke History: 2 Vascular Disease History: 0 Age Score: 1 Gender Score: 0   CrCl >100 mL/min Platelet count 207    Normal renal function, hx of CVA -2009 atrial fibrilation -2014  patient can hold Eliquis for 2 days prior to procedure.   **This guidance is not considered finalized until pre-operative APP has relayed final recommendations.**

## 2023-04-23 NOTE — Addendum Note (Signed)
Addended by: Anselm Lis A on: 04/23/2023 12:16 PM   Modules accepted: Orders

## 2023-04-23 NOTE — Telephone Encounter (Signed)
Pt is scheduled for tele preop 10/2 at 1:40pm. Med rec and consnet done    Patient Consent for Virtual Visit        Cory Petty has provided verbal consent on 04/23/2023 for a virtual visit (video or telephone).   CONSENT FOR VIRTUAL VISIT FOR:  Cory Petty  By participating in this virtual visit I agree to the following:  I hereby voluntarily request, consent and authorize [facility] and its employed or contracted physicians, physician assistants, nurse practitioners or other licensed health care professionals (the Practitioner), to provide me with telemedicine health care services (the "Services") as deemed necessary by the treating Practitioner. I acknowledge and consent to receive the Services by the Practitioner via telemedicine. I understand that the telemedicine visit will involve communicating with the Practitioner through live audiovisual communication technology and the disclosure of certain medical information by electronic transmission. I acknowledge that I have been given the opportunity to request an in-person assessment or other available alternative prior to the telemedicine visit and am voluntarily participating in the telemedicine visit.  I understand that I have the right to withhold or withdraw my consent to the use of telemedicine in the course of my care at any time, without affecting my right to future care or treatment, and that the Practitioner or I may terminate the telemedicine visit at any time. I understand that I have the right to inspect all information obtained and/or recorded in the course of the telemedicine visit and may receive copies of available information for a reasonable fee.  I understand that some of the potential risks of receiving the Services via telemedicine include:  Delay or interruption in medical evaluation due to technological equipment failure or disruption; Information transmitted may not be sufficient (e.g. poor resolution of images) to  allow for appropriate medical decision making by the Practitioner; and/or  In rare instances, security protocols could fail, causing a breach of personal health information.  Furthermore, I acknowledge that it is my responsibility to provide information about my medical history, conditions and care that is complete and accurate to the best of my ability. I acknowledge that Practitioner's advice, recommendations, and/or decision may be based on factors not within their control, such as incomplete or inaccurate data provided by me or distortions of diagnostic images or specimens that may result from electronic transmissions. I understand that the practice of medicine is not an exact science and that Practitioner makes no warranties or guarantees regarding treatment outcomes. I acknowledge that a copy of this consent can be made available to me via my patient portal [facility] MyChart), or I can request a printed copy by calling the office of [facility].    I understand that my insurance will be billed for this visit.   I have read or had this consent read to me. I understand the contents of this consent, which adequately explains the benefits and risks of the Services being provided via telemedicine.  I have been provided ample opportunity to ask questions regarding this consent and the Services and have had my questions answered to my satisfaction. I give my informed consent for the services to be provided through the use of telemedicine in my medical care

## 2023-04-23 NOTE — Telephone Encounter (Signed)
Name: Cory Petty  DOB: 12/25/1955  MRN: [id number]  Primary Cardiologist: Nicki Guadalajara, MD   Preoperative team, please contact this patient and set up a phone call appointment for further preoperative risk assessment. Please obtain consent and complete medication review. Thank you for your help.  I confirm that guidance regarding antiplatelet and oral anticoagulation therapy has been completed and, if necessary, noted below.  Per Pharm D, patient may hold Eliquis 2 days prior to procedure.    Carlos Levering, NP 04/23/2023, 11:47 AM [facility]

## 2023-05-03 ENCOUNTER — Other Ambulatory Visit: Payer: Self-pay | Admitting: Student

## 2023-05-03 ENCOUNTER — Other Ambulatory Visit (HOSPITAL_COMMUNITY): Payer: Self-pay

## 2023-05-03 MED ORDER — ATENOLOL 100 MG PO TABS
100.0000 mg | ORAL_TABLET | Freq: Two times a day (BID) | ORAL | 2 refills | Status: AC
  Filled 2023-05-03: qty 180, 90d supply, fill #0

## 2023-05-04 ENCOUNTER — Other Ambulatory Visit (HOSPITAL_COMMUNITY): Payer: Self-pay

## 2023-05-05 ENCOUNTER — Other Ambulatory Visit (HOSPITAL_COMMUNITY): Payer: Self-pay

## 2023-05-05 ENCOUNTER — Other Ambulatory Visit: Payer: Self-pay

## 2023-05-05 ENCOUNTER — Ambulatory Visit: Payer: No Typology Code available for payment source | Attending: Nurse Practitioner

## 2023-05-05 DIAGNOSIS — Z01810 Encounter for preprocedural cardiovascular examination: Secondary | ICD-10-CM

## 2023-05-05 MED ORDER — FUROSEMIDE 40 MG PO TABS
60.0000 mg | ORAL_TABLET | Freq: Every morning | ORAL | 2 refills | Status: AC
  Filled 2023-05-05: qty 135, 90d supply, fill #0

## 2023-05-05 MED ORDER — SIMVASTATIN 40 MG PO TABS
40.0000 mg | ORAL_TABLET | Freq: Every day | ORAL | 2 refills | Status: AC
  Filled 2023-05-05: qty 90, 90d supply, fill #0

## 2023-05-05 NOTE — Progress Notes (Signed)
Virtual Visit via Telephone Note   Because of Cory Petty's co-morbid illnesses, he is at least at moderate risk for complications without adequate follow up.  This format is felt to be most appropriate for this patient at this time.  The patient did not have access to video technology/had technical difficulties with video requiring transitioning to audio format only (telephone).  All issues noted in this document were discussed and addressed.  No physical exam could be performed with this format.  Please refer to the patient's chart for his consent to telehealth for [facility].  Evaluation Performed:  Preoperative cardiovascular risk assessment _____________   Date:  05/05/2023   Patient ID:  Cory Petty, DOB [birth year], MRN [id number] Patient Location:  Home Provider location:   Office  Primary Care Provider:  Benita Stabile, MD Primary Cardiologist:  Nicki Guadalajara, MD  Chief Complaint / Patient Profile   67 y.o. y/o male with a h/o AF, CVA 2009, OSA (not on CPAP), obesity, tobacco abuse who is pending left knee scope and presents today for telephonic preoperative cardiovascular risk assessment.  History of Present Illness    Cory Petty is a 67 y.o. male who presents via audio/video conferencing for a telehealth visit today.  Pt was last seen in cardiology clinic on 12/31/2022 by Marjie Skiff, PA.  At that time Cory Petty was doing well with no new cardiac complaints.  He was staying active doing strenuous yard work for exercise.  He had an echo completed that was stable.The patient is now pending procedure as outlined above. Since his last visit, he has been doing well with no new cardiac complaints.  He is slightly limited in his activities daily but is able to complete all ADLs without any cardiac complaints.  He denies chest pain, shortness of breath, lower extremity edema, fatigue, palpitations, melena, hematuria, hemoptysis, diaphoresis, weakness, presyncope, syncope,  orthopnea, and PND.    Past Medical History    Past Medical History:  Diagnosis Date   CVA (cerebral vascular accident) (HCC)    Remote, small   Dysrhythmia    Hyperlipidemia    Hypersomnia with sleep apnea    Sleep Study (07/16/2010) AHI-2.3/hr, AHI during REM-9.4/hr, RDI-5.3/hr, REM during REM-12.8/hr, AVG O2 during REM and NREM was 93.0%   Permanent atrial fibrillation [facility])    Past Surgical History:  Procedure Laterality Date   CARDIOVASCULAR STRESS TEST  06/18/2008   Perfusion defect in the inferior and apical myocardial region consistent with diaphragmatic attenuation. Remaining myocardium presents normal myocardial perfusion with no evidence of ischemia or infarct. EKG negative for ischemia.   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 in configuration or arterial feeders or venous drainage   COLONOSCOPY WITH PROPOFOL N/A 11/20/2022   Procedure: COLONOSCOPY WITH PROPOFOL;  Surgeon: Dolores Frame, MD;  Location: [facility];  Service: Gastroenterology;  Laterality: N/A;  10:00 am   ELBOW SURGERY Right    MASS EXCISION N/A 10/05/2022   Procedure: EXCISION MASS, BACK;  Surgeon: Lewie Chamber, DO;  Location: [facility] ORS;  Service: General;  Laterality: N/A;   POLYPECTOMY  11/20/2022   Procedure: POLYPECTOMY INTESTINAL;  Surgeon: Marguerita Merles, Reuel Boom, MD;  Location: [facility];  Service: Gastroenterology;;   TRANSTHORACIC ECHOCARDIOGRAM  08/01/2010   EF >55%, LA mild-moderately dilated,     Allergies  No Known Allergies  Home Medications    Prior to Admission medications   Medication Sig Start  Date End Date Taking? Authorizing Provider  acetaminophen (TYLENOL) 500 MG tablet Take 1,000 mg by mouth in the morning and at bedtime.    [provider]  apixaban (ELIQUIS) 5 MG TABS tablet Take 1 tablet (5 mg total) by mouth 2 (two) times daily. 12/31/22   Marjie Skiff E, PA-C  atenolol  (TENORMIN) 100 MG tablet Take 2 tablets (200 mg total) by mouth daily. 12/31/22   Marjie Skiff E, PA-C  atenolol (TENORMIN) 100 MG tablet Take 1 tablet (100 mg total) by mouth 2 (two) times daily. 05/03/23     buPROPion (WELLBUTRIN XL) 150 MG 24 hr tablet Take 1 tablet (150 mg total) by mouth daily. 02/01/23   Benita Stabile, MD  diphenhydrAMINE (BENADRYL) 25 mg capsule Take 25 mg by mouth 2 (two) times daily as needed for allergies.    [provider]  furosemide (LASIX) 40 MG tablet Take 1.5 tablets (60 mg total) by mouth in the morning. Patient taking differently: Take 40 mg by mouth in the morning. 12/31/22   Marjie Skiff E, PA-C  gabapentin (NEURONTIN) 300 MG capsule Take 1 capsule (300 mg total) by mouth at bedtime. 01/25/23     HYDROcodone-acetaminophen (NORCO/VICODIN) 5-325 MG tablet Take 1 tablet by mouth 2 (two) times daily as needed for severe pain or moderate pain.    [provider]  Melatonin 10 MG TABS Take 10 mg by mouth at bedtime.    [provider]  Multiple Vitamins-Minerals (MULTIVITAMIN WITH MINERALS) tablet Take 1 tablet by mouth in the morning. Men's One A Day 50+    [provider]  simvastatin (ZOCOR) 40 MG tablet Take 1 tablet (40 mg total) by mouth daily. 12/31/22   Corrin Parker, PA-C  traMADol (ULTRAM) 50 MG tablet Take 50 mg by mouth every 12 (twelve) hours as needed for moderate pain or severe pain.    [provider]    Physical Exam    Vital Signs:  Cory Petty does not have vital signs available for review today.  Given telephonic nature of communication, physical exam is limited. AAOx3. NAD. Normal affect.  Speech and respirations are unlabored.  Accessory Clinical Findings    None  Assessment & Plan    1.  Preoperative Cardiovascular Risk Assessment: -Patient's RCRI score is 0.9%  The patient affirms he has been doing well without any new cardiac symptoms. They are able to achieve 7 METS without cardiac  limitations. Therefore, based on ACC/AHA guidelines, the patient would be at acceptable risk for the planned procedure without further cardiovascular testing. The patient was advised that if he develops new symptoms prior to surgery to contact our office to arrange for a follow-up visit, and he verbalized understanding.   The patient was advised that if he develops new symptoms prior to surgery to contact our office to arrange for a follow-up visit, and he verbalized understanding.  Patient can hold Eliquis 2 days prior to procedure  A copy of this note will be routed to requesting surgeon.  Time:   Today, I have spent 6 minutes with the patient with telehealth technology discussing medical history, symptoms, and management plan.     Napoleon Form, Leodis Rains, NP  05/05/2023, 7:32 AM

## 2023-05-07 ENCOUNTER — Other Ambulatory Visit: Payer: Self-pay

## 2023-05-07 ENCOUNTER — Other Ambulatory Visit (HOSPITAL_COMMUNITY): Payer: Self-pay

## 2023-05-07 DIAGNOSIS — X58XXXD Exposure to other specified factors, subsequent encounter: Secondary | ICD-10-CM | POA: Diagnosis not present

## 2023-05-07 DIAGNOSIS — M25562 Pain in left knee: Secondary | ICD-10-CM | POA: Diagnosis not present

## 2023-05-07 DIAGNOSIS — S83242D Other tear of medial meniscus, current injury, left knee, subsequent encounter: Secondary | ICD-10-CM | POA: Diagnosis not present

## 2023-05-07 DIAGNOSIS — M25561 Pain in right knee: Secondary | ICD-10-CM | POA: Diagnosis not present

## 2023-05-07 DIAGNOSIS — G8929 Other chronic pain: Secondary | ICD-10-CM | POA: Diagnosis not present

## 2023-05-07 DIAGNOSIS — Z713 Dietary counseling and surveillance: Secondary | ICD-10-CM | POA: Diagnosis not present

## 2023-05-07 DIAGNOSIS — M545 Low back pain, unspecified: Secondary | ICD-10-CM | POA: Diagnosis not present

## 2023-05-07 DIAGNOSIS — Z87891 Personal history of nicotine dependence: Secondary | ICD-10-CM | POA: Diagnosis not present

## 2023-05-07 DIAGNOSIS — Z6841 Body Mass Index (BMI) 40.0 and over, adult: Secondary | ICD-10-CM | POA: Diagnosis not present

## 2023-05-07 DIAGNOSIS — I10 Essential (primary) hypertension: Secondary | ICD-10-CM | POA: Diagnosis not present

## 2023-05-07 DIAGNOSIS — Z79899 Other long term (current) drug therapy: Secondary | ICD-10-CM | POA: Diagnosis not present

## 2023-05-07 MED ORDER — TRAMADOL HCL 50 MG PO TABS
50.0000 mg | ORAL_TABLET | Freq: Two times a day (BID) | ORAL | 0 refills | Status: DC | PRN
  Filled 2023-05-07: qty 40, 20d supply, fill #0

## 2023-05-07 MED ORDER — HYDROCODONE-ACETAMINOPHEN 5-325 MG PO TABS
1.0000 | ORAL_TABLET | Freq: Two times a day (BID) | ORAL | 0 refills | Status: DC | PRN
  Filled 2023-05-07 – 2023-05-18 (×2): qty 60, 30d supply, fill #0

## 2023-05-10 ENCOUNTER — Other Ambulatory Visit (HOSPITAL_COMMUNITY): Payer: Self-pay

## 2023-05-18 ENCOUNTER — Other Ambulatory Visit (HOSPITAL_COMMUNITY): Payer: Self-pay

## 2023-05-18 ENCOUNTER — Other Ambulatory Visit: Payer: Self-pay

## 2023-05-27 DIAGNOSIS — M2242 Chondromalacia patellae, left knee: Secondary | ICD-10-CM | POA: Diagnosis not present

## 2023-05-27 DIAGNOSIS — S83242A Other tear of medial meniscus, current injury, left knee, initial encounter: Secondary | ICD-10-CM | POA: Diagnosis not present

## 2023-05-27 DIAGNOSIS — G8918 Other acute postprocedural pain: Secondary | ICD-10-CM | POA: Diagnosis not present

## 2023-05-27 DIAGNOSIS — S83282A Other tear of lateral meniscus, current injury, left knee, initial encounter: Secondary | ICD-10-CM | POA: Diagnosis not present

## 2023-05-31 DIAGNOSIS — Z6841 Body Mass Index (BMI) 40.0 and over, adult: Secondary | ICD-10-CM | POA: Diagnosis not present

## 2023-05-31 DIAGNOSIS — Z008 Encounter for other general examination: Secondary | ICD-10-CM | POA: Diagnosis not present

## 2023-05-31 DIAGNOSIS — M545 Low back pain, unspecified: Secondary | ICD-10-CM | POA: Diagnosis not present

## 2023-05-31 DIAGNOSIS — I4891 Unspecified atrial fibrillation: Secondary | ICD-10-CM | POA: Diagnosis not present

## 2023-05-31 DIAGNOSIS — Z7901 Long term (current) use of anticoagulants: Secondary | ICD-10-CM | POA: Diagnosis not present

## 2023-05-31 DIAGNOSIS — D6869 Other thrombophilia: Secondary | ICD-10-CM | POA: Diagnosis not present

## 2023-06-04 ENCOUNTER — Other Ambulatory Visit: Payer: Self-pay

## 2023-06-04 ENCOUNTER — Other Ambulatory Visit (HOSPITAL_COMMUNITY): Payer: Self-pay

## 2023-06-08 ENCOUNTER — Other Ambulatory Visit (HOSPITAL_COMMUNITY): Payer: Self-pay

## 2023-06-08 MED ORDER — HYDROCODONE-ACETAMINOPHEN 5-325 MG PO TABS
1.0000 | ORAL_TABLET | Freq: Two times a day (BID) | ORAL | 0 refills | Status: AC | PRN
Start: 2023-06-07 — End: ?
  Filled 2023-06-14 – 2023-06-15 (×2): qty 60, 30d supply, fill #0

## 2023-06-14 ENCOUNTER — Other Ambulatory Visit (HOSPITAL_COMMUNITY): Payer: Self-pay

## 2023-06-15 ENCOUNTER — Other Ambulatory Visit: Payer: Self-pay

## 2023-06-16 ENCOUNTER — Other Ambulatory Visit (HOSPITAL_COMMUNITY): Payer: Self-pay

## 2023-06-16 ENCOUNTER — Other Ambulatory Visit: Payer: Self-pay

## 2023-06-16 MED ORDER — TRAMADOL HCL 50 MG PO TABS
50.0000 mg | ORAL_TABLET | Freq: Two times a day (BID) | ORAL | 0 refills | Status: AC | PRN
  Filled 2023-06-16: qty 40, 20d supply, fill #0

## 2023-08-06 DIAGNOSIS — R972 Elevated prostate specific antigen [PSA]: Secondary | ICD-10-CM | POA: Diagnosis not present

## 2023-08-06 DIAGNOSIS — R7303 Prediabetes: Secondary | ICD-10-CM | POA: Diagnosis not present

## 2023-08-06 DIAGNOSIS — E782 Mixed hyperlipidemia: Secondary | ICD-10-CM | POA: Diagnosis not present

## 2023-08-10 LAB — LAB REPORT - SCANNED
A1c: 6
Albumin, Urine POC: 21.1
Albumin/Creatinine Ratio, Urine, POC: 35
Creatinine, POC: 59.9 mg/dL
EGFR: 68

## 2023-08-13 DIAGNOSIS — I482 Chronic atrial fibrillation, unspecified: Secondary | ICD-10-CM | POA: Diagnosis not present

## 2023-08-13 DIAGNOSIS — G609 Hereditary and idiopathic neuropathy, unspecified: Secondary | ICD-10-CM | POA: Diagnosis not present

## 2023-08-13 DIAGNOSIS — S83242D Other tear of medial meniscus, current injury, left knee, subsequent encounter: Secondary | ICD-10-CM | POA: Diagnosis not present

## 2023-08-13 DIAGNOSIS — R600 Localized edema: Secondary | ICD-10-CM | POA: Diagnosis not present

## 2023-08-13 DIAGNOSIS — M25561 Pain in right knee: Secondary | ICD-10-CM | POA: Diagnosis not present

## 2023-08-13 DIAGNOSIS — F17201 Nicotine dependence, unspecified, in remission: Secondary | ICD-10-CM | POA: Diagnosis not present

## 2023-08-13 DIAGNOSIS — M25562 Pain in left knee: Secondary | ICD-10-CM | POA: Diagnosis not present

## 2023-08-13 DIAGNOSIS — M545 Low back pain, unspecified: Secondary | ICD-10-CM | POA: Diagnosis not present

## 2023-08-13 DIAGNOSIS — L723 Sebaceous cyst: Secondary | ICD-10-CM | POA: Diagnosis not present

## 2023-08-13 DIAGNOSIS — I10 Essential (primary) hypertension: Secondary | ICD-10-CM | POA: Diagnosis not present

## 2023-08-13 DIAGNOSIS — E782 Mixed hyperlipidemia: Secondary | ICD-10-CM | POA: Diagnosis not present

## 2023-09-22 DIAGNOSIS — I110 Hypertensive heart disease with heart failure: Secondary | ICD-10-CM | POA: Diagnosis not present

## 2023-09-22 DIAGNOSIS — I509 Heart failure, unspecified: Secondary | ICD-10-CM | POA: Diagnosis not present

## 2023-09-22 DIAGNOSIS — I482 Chronic atrial fibrillation, unspecified: Secondary | ICD-10-CM | POA: Diagnosis not present

## 2023-09-22 DIAGNOSIS — Z6841 Body Mass Index (BMI) 40.0 and over, adult: Secondary | ICD-10-CM | POA: Diagnosis not present

## 2023-09-22 DIAGNOSIS — I10 Essential (primary) hypertension: Secondary | ICD-10-CM | POA: Diagnosis not present

## 2023-09-22 DIAGNOSIS — E785 Hyperlipidemia, unspecified: Secondary | ICD-10-CM | POA: Diagnosis not present

## 2023-09-22 DIAGNOSIS — D6869 Other thrombophilia: Secondary | ICD-10-CM | POA: Diagnosis not present

## 2023-09-22 DIAGNOSIS — G629 Polyneuropathy, unspecified: Secondary | ICD-10-CM | POA: Diagnosis not present

## 2023-09-22 DIAGNOSIS — K5900 Constipation, unspecified: Secondary | ICD-10-CM | POA: Diagnosis not present

## 2023-09-22 DIAGNOSIS — G8929 Other chronic pain: Secondary | ICD-10-CM | POA: Diagnosis not present

## 2023-09-22 DIAGNOSIS — F3342 Major depressive disorder, recurrent, in full remission: Secondary | ICD-10-CM | POA: Diagnosis not present

## 2023-12-17 ENCOUNTER — Ambulatory Visit
Payer: No Typology Code available for payment source | Attending: Cardiovascular Disease | Admitting: Cardiovascular Disease

## 2023-12-17 ENCOUNTER — Encounter: Payer: Self-pay | Admitting: Cardiovascular Disease

## 2023-12-17 VITALS — BP 122/76 | HR 64 | Ht 76.0 in | Wt 334.6 lb

## 2023-12-17 DIAGNOSIS — I4821 Permanent atrial fibrillation: Secondary | ICD-10-CM | POA: Diagnosis not present

## 2023-12-17 DIAGNOSIS — Z720 Tobacco use: Secondary | ICD-10-CM

## 2023-12-17 DIAGNOSIS — Z7901 Long term (current) use of anticoagulants: Secondary | ICD-10-CM | POA: Diagnosis not present

## 2023-12-17 DIAGNOSIS — E785 Hyperlipidemia, unspecified: Secondary | ICD-10-CM | POA: Diagnosis not present

## 2023-12-17 DIAGNOSIS — I340 Nonrheumatic mitral (valve) insufficiency: Secondary | ICD-10-CM

## 2023-12-17 NOTE — Progress Notes (Signed)
 Patient ID: Cory Petty, male   DOB: 1956-02-23, 68 y.o.   MRN: [id number]       HPI: Cory Petty is a 68 y.o. male who presents for a 70 month cardiology evaluation  Cory Petty has a history of permanent atrial fibrillation and has been on chronic Coumadin  therapy.  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  He has a long-standing history of tobacco use and smoked 1 pack per day. He has a history of hyperlipidemia, and moderate obesity.  A sleep study in December 2011 revealed mild increased upper airways resistance syndrome with mild sleep apnea, only during REM sleep and he is not on CPAP therapy.  He has been on disability since his stroke and after his wife's death.  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  Over the past year, Cory Petty denies any change in symptoms.   He walks a aproximately  3000 steps  For 3-4 days per week.  He is unaware of any chest pressure.  He denies shortness of breath.  He is unaware of palpitations.  He is regular in getting his Coumadin  checked.    He underwent lipid studies in May 2017 and on simvastatin  40 mg LDL was 70, total cholesterol 125, HDL 45, triglycerides 50.   He has been disabled since 2009 as result of his stroke.  I last saw him in May 2019 at which time he had some mild residual left and leg weakness.   He continued to be on warfarin for anticoagulation.  He has not had interest in DOAC therapy due to increased cost.  He was on atenolol  200 mg daily, digoxin  0.125 mg for rate control of his permanent atrial fibrillation.  Peripheral neuropathy was treated with gabapentin  300 mg daily.  He was undergoing monthly INR checks.  He denied bleeding.  He denied any chest pain, PND orthopnea but at times had noticed some mild lower extremity edema.  I saw him in July 2020 which time he denied any chest pain or dizziness.   His atrial fibrillation rate has been well controlled.  He does note intermittent  right leg swelling greater than left.  Recently had undergone laboratory by Dr. Zack Hall on January 02, 2019.  At that time INR was 3.2.  Digoxin  level less than 0.4.  Total cholesterol was 110, triglycerides 61, HDL 39, and LDL 59.  Hemoglobin A1c was 5.9.  He had stable hemoglobin hematocrit.   I saw him in June July 2021.  During the COVID pandemic he admitted to significant weight gain.  His weight in 2020 was 320 pounds which increased at 348.  He noted some moderate amount of leg swelling.  He denied any chest pain, PND orthopnea.  He has been seeing Dr. Zack Hall for his primary care who has been checking laboratory.    I saw him on February 17, 2021.  Since his prior evaluation he continued to remain active working in his yard.  He denied any chest pain or palpitations.   He has permanent atrial fibrillation and continues to be on Eliquis  5 mg twice a day.  He continues to be on atenolol  200 mg daily for rate control.  Over the past year his furosemide  dose has been increased by Dr. Izetta Marshall and 3 weeks ago he was increased up to 60 mg daily.  He brought with him laboratory from January 24, 2021  which I reviewed with him in detail.  BUN was 21 and creatinine 1.1.  Lipid studies were excellent with an LDL of 71 on his current simvastatin  40 mg dose.  Hematocrit were stable at 14.3 and 49.9.  Hemoglobin A1c was 5.8.  He quit smoking in March 2022 and was on Wellbutrin  XL with benefit.  When I last saw him on January 05, 2022 he felt well.  He admits to weight gain.  He denies chest pain or palpitations, presyncope or syncope.  He has permanent atrial fibrillation and continues to be on Eliquis  5 mg twice a day for anticoagulation.  He has been taking atenolol  20 mg daily.  He takes gabapentin  for restless legs.  He continues to be on furosemide  60 mg which has been beneficial for his lower extremity edema.  He is tolerating simvastatin  40 mg.  Since his prior visit, he had weight gain and BMI was increased to 43.7.   Weight loss and exercise was recommended.  Presently, Cory Petty feels well.  He quit smoking in March 2022 and has not resumed.  Sees Dr. Izetta Marshall in [place].  He has been successful with weight loss from 357 down to 334 today.  He denies chest pain or shortness of breath.  He has longstanding persistent atrial fibrillation.  His last echo Doppler study was on January 28, 2023 which showed EF 55 to 60%.  He had normal RV systolic function.  There was severe biatrial enlargement.  There was no aortic regurgitation.  Aortic valve mean gradient was 6.2 with peak gradient 14.8.  He continues to be on atenolol  100 mg twice a day for rate control, Eliquis  5 mg twice a day for anticoagulation.  He takes furosemide  60 mg in the morning and continues to be on simvastatin  40 mg daily.  He is on Neurontin  for neuropathy and takes bupropion  daily.  He presents for evaluation.   Past Medical History:  Diagnosis Date   CVA (cerebral vascular accident) (HCC)    Remote, small   Dysrhythmia    Hyperlipidemia    Hypersomnia with sleep apnea    Sleep Study (07/16/2010) AHI-2.3/hr, AHI during REM-9.4/hr, RDI-5.3/hr, REM during REM-12.8/hr, AVG O2 during REM and NREM was 93.0%   Permanent atrial fibrillation [facility])     Past Surgical History:  Procedure Laterality Date   CARDIOVASCULAR STRESS TEST  06/18/2008   Perfusion defect in the inferior and apical myocardial region consistent with diaphragmatic attenuation. Remaining myocardium presents normal myocardial perfusion with no evidence of ischemia or infarct. EKG negative for ischemia.   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 in configuration or arterial feeders or venous drainage   COLONOSCOPY WITH PROPOFOL  N/A 11/20/2022   Procedure: COLONOSCOPY WITH PROPOFOL ;  Surgeon: Urban Garden, MD;  Location: [facility];  Service: Gastroenterology;  Laterality: N/A;  10:00 am   ELBOW SURGERY Right     MASS EXCISION N/A 10/05/2022   Procedure: EXCISION MASS, BACK;  Surgeon: Marijo Shove, DO;  Location: [facility];  Service: General;  Laterality: N/A;   POLYPECTOMY  11/20/2022   Procedure: POLYPECTOMY INTESTINAL;  Surgeon: Umberto Ganong, Bearl Limes, MD;  Location: [facility];  Service: Gastroenterology;;   TRANSTHORACIC ECHOCARDIOGRAM  08/01/2010   EF >55%, LA mild-moderately dilated,     No Known Allergies  Current Outpatient Medications  Medication Sig Dispense Refill   apixaban  (ELIQUIS ) 5 MG TABS tablet Take 1 tablet (5 mg total) by mouth 2 (  two) times daily. 90 tablet 3   atenolol  (TENORMIN ) 100 MG tablet Take 1 tablet (100 mg total) by mouth 2 (two) times daily. 180 tablet 2   buPROPion  (WELLBUTRIN  XL) 150 MG 24 hr tablet Take 1 tablet (150 mg total) by mouth daily. 90 tablet 1   diphenhydrAMINE (BENADRYL) 25 mg capsule Take 25 mg by mouth 2 (two) times daily as needed for allergies.     furosemide  (LASIX ) 40 MG tablet Take 1.5 tablets (60 mg total) by mouth in the morning. 135 tablet 2   gabapentin  (NEURONTIN ) 300 MG capsule Take 1 capsule (300 mg total) by mouth at bedtime. 90 capsule 2   HYDROcodone -acetaminophen  (NORCO/VICODIN) 5-325 MG tablet Take 1 tablet by mouth 2 (two) times daily as needed. 60 tablet 0   Melatonin 10 MG TABS Take 10 mg by mouth at bedtime.     Multiple Vitamins-Minerals (MULTIVITAMIN WITH MINERALS) tablet Take 1 tablet by mouth in the morning. Men's One A Day 50+     simvastatin  (ZOCOR ) 40 MG tablet Take 1 tablet (40 mg total) by mouth daily. 90 tablet 2   traMADol  (ULTRAM ) 50 MG tablet Take 1 tablet (50 mg total) by mouth 2 (two) times daily as needed. 40 tablet 0   No current facility-administered medications for this visit.    Social History   Socioeconomic History   Marital status: Widowed    Spouse name: Not on file   Number of children: Not on file   Years of education: Not on file   Highest education level: Not on file   Occupational History   Not on file  Tobacco Use   Smoking status: Former    Current packs/day: 0.00    Average packs/day: 1 pack/day for 29.0 years (29.0 ttl pk-yrs)    Types: Cigarettes    Start date: 08/04/1991    Quit date: 08/03/2020    Years since quitting: 3.3   Smokeless tobacco: Never  Substance and Sexual Activity   Alcohol use: Yes    Comment: 2 beers per month   Drug use: Never   Sexual activity: Not on file  Other Topics Concern   Not on file  Social History Narrative   Not on file   Social Drivers of Health   Financial Resource Strain: Not on file  Food Insecurity: Not on file  Transportation Needs: Not on file  Physical Activity: Not on file  Stress: Not on file  Social Connections: Not on file  Intimate Partner Violence: Not on file   Socially, he is widowed.  He has no children.  Does smoke tobacco one pack per day.  He is a former Naval architect.  Family History  Problem Relation Age of Onset   Cancer Mother        Lung cancer   Cancer Father        Stomach cancer   Cancer Brother     ROS General: Negative; No fevers, chills, or night sweats;  Positive for obesity HEENT: Negative; No changes in vision or hearing, sinus congestion, difficulty swallowing Pulmonary: Negative; No cough, wheezing, shortness of breath, hemoptysis Cardiovascular:  See HPI Bilateral lower extremity edema, now wearing support stockings GI: Negative; No nausea, vomiting, diarrhea, or abdominal pain GU: Negative; No dysuria, hematuria, or difficulty voiding Musculoskeletal: Negative; no myalgias, joint pain, or weakness Hematologic/Oncology: Negative; no easy bruising, bleeding Endocrine: Negative; no heat/cold intolerance; no diabetes Neuro: stroke in 2009 Skin: Negative; No rashes or skin lesions Psychiatric: Negative; No  behavioral problems, depression Sleep: Negative; No snoring, daytime sleepiness, hypersomnolence, bruxism, restless legs, hypnogognic hallucinations, no  cataplexy Other comprehensive 14 point system review is negative.  PE BP 122/76   Pulse 64   Ht 6\' 4"  (1.93 m)   Wt (!) 334 lb 9.6 oz (151.8 kg)   SpO2 94%   BMI 40.73 kg/m    Repeat blood pressure by me was 138/70  Wt Readings from Last 3 Encounters:  12/17/23 (!) 334 lb 9.6 oz (151.8 kg)  12/31/22 (!) 354 lb 3.2 oz (160.7 kg)  11/17/22 (!) 354 lb (160.6 kg)   General: Alert, oriented, no distress.  Skin: normal turgor, no rashes, warm and dry HEENT: Normocephalic, atraumatic. Pupils equal round and reactive to light; sclera anicteric; extraocular muscles intact; Nose without nasal septal hypertrophy Mouth/Parynx benign; Mallinpatti scale 3 Neck: No JVD, no carotid bruits; normal carotid upstroke Lungs: clear to ausculatation and percussion; no wheezing or rales Chest wall: without tenderness to palpitation Heart: PMI not displaced, irregular irregular with controlled ventricular rate in the upper 50s, s1 s2 normal, 1/6 systolic murmur, no diastolic murmur, no rubs, gallops, thrills, or heaves Abdomen: soft, nontender; no hepatosplenomehaly, BS+; abdominal aorta nontender and not dilated by palpation. Back: no CVA tenderness Pulses 2+ Musculoskeletal: full range of motion, normal strength, no joint deformities Extremities: Wear support stockings.  Trivial residual edema; no clubbing cyanosis or edema, Homan's sign negative  Neurologic: grossly nonfocal; Cranial nerves grossly wnl Psychologic: Normal mood and affect  EKG Interpretation Date/Time:  Friday Dec 17 2023 10:31:21 EDT Ventricular Rate:  64 PR Interval:    QRS Duration:  160 QT Interval:  444 QTC Calculation: 458 R Axis:   -53  Text Interpretation: Atrial fibrillation Left axis deviation Left bundle branch block When compared with ECG of 07-Jan-2008 10:32, Left bundle branch block is now Present Confirmed by Magnus Schuller ([id number]) on 12/17/2023 11:15:37 AM     January 05, 2022 ECG (independently read by me): Atrial  fibrillation at 57; RBBB, LAHB  February 17, 2021 ECG (independently read by me): Atrial fibrillation with ventricular rate at 59 bpm, right bundle branch block, left anterior hemiblock consistent with bifascicular block.  QTc interval 451 ms  July 2021 ECG (independently read by me): Atrial fibrillation at 61; RBBB with repolarization changes  July 2020 ECG (independently read by me): AF at 57; RBBB, LAHB  May 2019 ECG (independently read by me): Atrial fibrillation at 71 bpm.  LVH by voltage.  Left axis deviation.  Normal intervals.  April 2018 ECG (independently read by me): Atrial fibrillation with a ventricular rate at 67.  Nonspecific interventricular conduction delay, late.  April 2016 ECG (independently read by me):  Atrial fibrillation with a controlled ventricular response at 59 bpm.  Normal intervals.   April 2015 ECG (independently read by me): Atrial fibrillation at 67 beats per minute.  Possible LVH by voltage criteria in aVL.  No significant ST-T change.  LABS:     Latest Ref Rng & Units 10/01/2022   12:57 PM 12/02/2017   11:04 AM 12/03/2016   10:20 AM  BMP  Glucose 70 - 99 mg/dL 952  91  97   BUN 8 - 23 mg/dL 20  11  13    Creatinine 0.61 - 1.24 mg/dL 8.41  3.24  4.01   BUN/Creat Ratio 10 - 24  12    Sodium 135 - 145 mmol/L 137  141  141   Potassium 3.5 - 5.1 mmol/L 4.1  5.2  4.6   Chloride 98 - 111 mmol/L 101  101  106   CO2 22 - 32 mmol/L 28  25  28    Calcium 8.9 - 10.3 mg/dL 8.6  9.6  8.8        Latest Ref Rng & Units 12/02/2017   11:04 AM 12/03/2016   10:20 AM 12/04/2015   10:34 AM  Hepatic Function  Total Protein 6.0 - 8.5 g/dL 7.5  6.8  6.9   Albumin 3.6 - 4.8 g/dL 4.4  3.8  4.1   AST 0 - 40 IU/L 16  17  15    ALT 0 - 44 IU/L 14  14  13    Alk Phosphatase 39 - 117 IU/L 97  71  81   Total Bilirubin 0.0 - 1.2 mg/dL 0.5  0.6  0.6        Latest Ref Rng & Units 12/02/2017   11:04 AM 12/03/2016   10:20 AM 12/04/2015   10:34 AM  CBC  WBC 3.4 - 10.8 x10E3/uL 6.8  6.2  6.8    Hemoglobin 13.0 - 17.7 g/dL 16.1  09.6  04.5   Hematocrit 37.5 - 51.0 % 47.9  43.1  48.3   Platelets 150 - 379 x10E3/uL 191  202  209    Lab Results  Component Value Date   TSH 1.800 12/02/2017    BNP No results found for: "PROBNP"  Lipid Panel     Component Value Date/Time   CHOL 128 12/02/2017 1104   TRIG 57 12/02/2017 1104   HDL 42 12/02/2017 1104   CHOLHDL 3.0 12/02/2017 1104   CHOLHDL 2.7 12/03/2016 1020   VLDL 14 12/03/2016 1020   LDLCALC 75 12/02/2017 1104   INR today 2.2  RADIOLOGY: No results found.  IMPRESSION:  1. Permanent atrial fibrillation (HCC)   2. Long term current use of anticoagulant therapy   3. Hyperlipidemia LDL goal <70   4. Morbid obesity (HCC)   5. Tobacco abuse: quit March 2022    ASSESSMENT AND PLAN: [name] is a 68 year-old gentleman with a long-standing history of permanent atrial fibrillation since 1990 for which he was initially on warfarin for anticoagulation therapy and later changed to apixaban .  He suffered a small CVA in June 2009,  Since that time, he is retired and has been on disability.  He continues to be in permanent atrial fibrillation with excellent rate control.  He has chronic right bundle branch block and left anterior hemiblock which is stable.  His previous progressive lower extremity edema has stabilized with furosemide  60 mg daily dosing.  If necessary he takes an extra Lasix  20 mg.  Blood pressure today is stable on atenolol  100 mg twice a day and his furosemide .  Fortunately, he quit tobacco in March 2022.  At his office visit in June 2023 he had gained weight up to 357.  Today's weight is down to 334.  He denies chest pain or shortness of breath.  He had seen Sharren Decree, [place] in May 2024 and had an excellent experience with her.  He is aware of my upcoming retirement.  I will transition him to see Sharren Decree, PA in 12 months and will need to be transitioned to another cardiologist following my retirement.  He  has been a pleasure taking care of him for these many years.   Millicent Ally, MD, FACC  12/19/2023 3:29 PM

## 2023-12-17 NOTE — Patient Instructions (Signed)
 Medication Instructions:  No changes *If you need a refill on your cardiac medications before your next appointment, please call your pharmacy*  Lab Work: No labs  Testing/Procedures: No testing  Follow-Up: At [facility], you and your health needs are our priority.  As part of our continuing mission to provide you with exceptional heart care, our providers are all part of one team.  This team includes your primary Cardiologist (physician) and Advanced Practice Providers or APPs (Physician Assistants and Nurse Practitioners) who all work together to provide you with the care you need, when you need it.  Your next appointment:   1 year(s)  Provider:   Callie Goodrich, PA-C  We recommend signing up for the patient portal called "MyChart".  Sign up information is provided on this After Visit Summary.  MyChart is used to connect with patients for Virtual Visits (Telemedicine).  Patients are able to view lab/test results, encounter notes, upcoming appointments, etc.  Non-urgent messages can be sent to your provider as well.   To learn more about what you can do with MyChart, go to [email or web address].

## 2023-12-19 ENCOUNTER — Encounter: Payer: Self-pay | Admitting: Cardiovascular Disease

## 2023-12-20 DIAGNOSIS — M1712 Unilateral primary osteoarthritis, left knee: Secondary | ICD-10-CM | POA: Diagnosis not present

## 2024-02-03 DIAGNOSIS — E782 Mixed hyperlipidemia: Secondary | ICD-10-CM | POA: Diagnosis not present

## 2024-02-03 DIAGNOSIS — E875 Hyperkalemia: Secondary | ICD-10-CM | POA: Diagnosis not present

## 2024-02-03 DIAGNOSIS — R7303 Prediabetes: Secondary | ICD-10-CM | POA: Diagnosis not present

## 2024-02-05 LAB — LAB REPORT - SCANNED
A1c: 6
Albumin, Urine POC: <3
Albumin/Creatinine Ratio, Urine, POC: <15
Creatinine, POC: 19.4 mg/dL
EGFR: 75

## 2024-02-10 DIAGNOSIS — G8929 Other chronic pain: Secondary | ICD-10-CM | POA: Diagnosis not present

## 2024-02-10 DIAGNOSIS — M545 Low back pain, unspecified: Secondary | ICD-10-CM | POA: Diagnosis not present

## 2024-02-10 DIAGNOSIS — M25561 Pain in right knee: Secondary | ICD-10-CM | POA: Diagnosis not present

## 2024-02-10 DIAGNOSIS — G609 Hereditary and idiopathic neuropathy, unspecified: Secondary | ICD-10-CM | POA: Diagnosis not present

## 2024-02-10 DIAGNOSIS — S83242D Other tear of medial meniscus, current injury, left knee, subsequent encounter: Secondary | ICD-10-CM | POA: Diagnosis not present

## 2024-02-10 DIAGNOSIS — R600 Localized edema: Secondary | ICD-10-CM | POA: Diagnosis not present

## 2024-02-10 DIAGNOSIS — I482 Chronic atrial fibrillation, unspecified: Secondary | ICD-10-CM | POA: Diagnosis not present

## 2024-02-10 DIAGNOSIS — M25562 Pain in left knee: Secondary | ICD-10-CM | POA: Diagnosis not present

## 2024-02-10 DIAGNOSIS — E782 Mixed hyperlipidemia: Secondary | ICD-10-CM | POA: Diagnosis not present

## 2024-02-10 DIAGNOSIS — I10 Essential (primary) hypertension: Secondary | ICD-10-CM | POA: Diagnosis not present

## 2024-02-10 DIAGNOSIS — K054 Periodontosis: Secondary | ICD-10-CM | POA: Diagnosis not present

## 2024-02-10 DIAGNOSIS — F17201 Nicotine dependence, unspecified, in remission: Secondary | ICD-10-CM | POA: Diagnosis not present

## 2024-03-20 DIAGNOSIS — M1712 Unilateral primary osteoarthritis, left knee: Secondary | ICD-10-CM | POA: Diagnosis not present

## 2024-05-12 DIAGNOSIS — R251 Tremor, unspecified: Secondary | ICD-10-CM | POA: Diagnosis not present

## 2024-05-12 DIAGNOSIS — J302 Other seasonal allergic rhinitis: Secondary | ICD-10-CM | POA: Diagnosis not present

## 2024-05-12 DIAGNOSIS — M545 Low back pain, unspecified: Secondary | ICD-10-CM | POA: Diagnosis not present

## 2024-05-12 DIAGNOSIS — F17201 Nicotine dependence, unspecified, in remission: Secondary | ICD-10-CM | POA: Diagnosis not present

## 2024-05-12 DIAGNOSIS — Z0000 Encounter for general adult medical examination without abnormal findings: Secondary | ICD-10-CM | POA: Diagnosis not present

## 2024-05-12 DIAGNOSIS — R5383 Other fatigue: Secondary | ICD-10-CM | POA: Diagnosis not present

## 2024-05-12 DIAGNOSIS — M25561 Pain in right knee: Secondary | ICD-10-CM | POA: Diagnosis not present

## 2024-05-12 DIAGNOSIS — J3089 Other allergic rhinitis: Secondary | ICD-10-CM | POA: Diagnosis not present

## 2024-05-12 DIAGNOSIS — R600 Localized edema: Secondary | ICD-10-CM | POA: Diagnosis not present

## 2024-05-12 DIAGNOSIS — I482 Chronic atrial fibrillation, unspecified: Secondary | ICD-10-CM | POA: Diagnosis not present

## 2024-05-12 DIAGNOSIS — I10 Essential (primary) hypertension: Secondary | ICD-10-CM | POA: Diagnosis not present

## 2024-05-12 DIAGNOSIS — Z23 Encounter for immunization: Secondary | ICD-10-CM | POA: Diagnosis not present

## 2024-05-17 ENCOUNTER — Encounter (INDEPENDENT_AMBULATORY_CARE_PROVIDER_SITE_OTHER): Payer: Self-pay | Admitting: Gastroenterology

## 2024-05-17 DIAGNOSIS — M1712 Unilateral primary osteoarthritis, left knee: Secondary | ICD-10-CM | POA: Diagnosis not present

## 2024-06-16 DIAGNOSIS — M1712 Unilateral primary osteoarthritis, left knee: Secondary | ICD-10-CM | POA: Diagnosis not present

## 2024-06-16 DIAGNOSIS — Z7901 Long term (current) use of anticoagulants: Secondary | ICD-10-CM | POA: Diagnosis not present

## 2024-12-27 ENCOUNTER — Ambulatory Visit: Admitting: Student
